# Patient Record
Sex: Male | Born: 1950
Health system: Southern US, Community
[De-identification: ages and names within clinical notes are randomized; demographics above are authoritative.]

## PROBLEM LIST (undated history)

## (undated) DIAGNOSIS — J019 Acute sinusitis, unspecified: Secondary | ICD-10-CM

## (undated) DIAGNOSIS — Z872 Personal history of diseases of the skin and subcutaneous tissue: Secondary | ICD-10-CM

## (undated) DIAGNOSIS — Z86010 Personal history of colonic polyps: Secondary | ICD-10-CM

## (undated) DIAGNOSIS — J309 Allergic rhinitis, unspecified: Secondary | ICD-10-CM

## (undated) DIAGNOSIS — M545 Low back pain: Secondary | ICD-10-CM

## (undated) DIAGNOSIS — K5730 Diverticulosis of large intestine without perforation or abscess without bleeding: Secondary | ICD-10-CM

## (undated) DIAGNOSIS — E669 Obesity, unspecified: Secondary | ICD-10-CM

## (undated) DIAGNOSIS — G4733 Obstructive sleep apnea (adult) (pediatric): Secondary | ICD-10-CM

## (undated) DIAGNOSIS — R5383 Other fatigue: Secondary | ICD-10-CM

## (undated) DIAGNOSIS — E039 Hypothyroidism, unspecified: Secondary | ICD-10-CM

## (undated) DIAGNOSIS — E785 Hyperlipidemia, unspecified: Secondary | ICD-10-CM

## (undated) DIAGNOSIS — Z87448 Personal history of other diseases of urinary system: Secondary | ICD-10-CM

## (undated) DIAGNOSIS — R5381 Other malaise: Secondary | ICD-10-CM

## (undated) DIAGNOSIS — F411 Generalized anxiety disorder: Secondary | ICD-10-CM

## (undated) HISTORY — DX: Obstructive sleep apnea (adult) (pediatric): G47.33

## (undated) HISTORY — DX: Other malaise: R53.81

## (undated) HISTORY — DX: Diverticulosis of large intestine without perforation or abscess without bleeding: K57.30

## (undated) HISTORY — DX: Personal history of diseases of the skin and subcutaneous tissue: Z87.2

## (undated) HISTORY — DX: Personal history of colonic polyps: Z86.010

## (undated) HISTORY — DX: Personal history of other diseases of urinary system: Z87.448

## (undated) HISTORY — DX: Obesity, unspecified: E66.9

## (undated) HISTORY — DX: Generalized anxiety disorder: F41.1

## (undated) HISTORY — DX: Acute sinusitis, unspecified: J01.90

## (undated) HISTORY — DX: Other fatigue: R53.83

## (undated) HISTORY — DX: Hypothyroidism, unspecified: E03.9

## (undated) HISTORY — DX: Allergic rhinitis, unspecified: J30.9

## (undated) HISTORY — PX: OTHER SURGICAL HISTORY: SHX169

## (undated) HISTORY — DX: Hyperlipidemia, unspecified: E78.5

## (undated) HISTORY — DX: Low back pain: M54.5

## (undated) HISTORY — PX: TONSILLECTOMY: SUR1361

---

## 1999-08-25 ENCOUNTER — Encounter (INDEPENDENT_AMBULATORY_CARE_PROVIDER_SITE_OTHER): Payer: Self-pay | Admitting: Specialist

## 1999-08-25 ENCOUNTER — Ambulatory Visit (HOSPITAL_COMMUNITY): Admission: RE | Admit: 1999-08-25 | Discharge: 1999-08-25 | Payer: Self-pay | Admitting: Internal Medicine

## 2004-08-12 ENCOUNTER — Ambulatory Visit: Payer: Self-pay | Admitting: Internal Medicine

## 2004-08-18 ENCOUNTER — Ambulatory Visit: Payer: Self-pay | Admitting: Internal Medicine

## 2004-08-29 ENCOUNTER — Ambulatory Visit: Payer: Self-pay | Admitting: Emergency Medicine

## 2004-10-07 ENCOUNTER — Ambulatory Visit (HOSPITAL_BASED_OUTPATIENT_CLINIC_OR_DEPARTMENT_OTHER): Admission: RE | Admit: 2004-10-07 | Discharge: 2004-10-07 | Payer: Self-pay | Admitting: Emergency Medicine

## 2004-10-12 ENCOUNTER — Ambulatory Visit: Payer: Self-pay | Admitting: Pulmonary Disease

## 2004-11-14 ENCOUNTER — Ambulatory Visit: Payer: Self-pay | Admitting: Emergency Medicine

## 2004-12-22 ENCOUNTER — Ambulatory Visit: Payer: Self-pay | Admitting: Emergency Medicine

## 2005-06-07 ENCOUNTER — Ambulatory Visit: Payer: Self-pay | Admitting: Internal Medicine

## 2005-06-13 ENCOUNTER — Ambulatory Visit (HOSPITAL_COMMUNITY): Admission: RE | Admit: 2005-06-13 | Discharge: 2005-06-13 | Payer: Self-pay | Admitting: Internal Medicine

## 2005-08-15 ENCOUNTER — Ambulatory Visit: Payer: Self-pay | Admitting: Internal Medicine

## 2005-08-21 ENCOUNTER — Ambulatory Visit: Payer: Self-pay | Admitting: Internal Medicine

## 2005-10-24 ENCOUNTER — Ambulatory Visit: Payer: Self-pay | Admitting: Internal Medicine

## 2006-02-26 ENCOUNTER — Ambulatory Visit: Payer: Self-pay | Admitting: Internal Medicine

## 2006-10-09 ENCOUNTER — Encounter: Payer: Self-pay | Admitting: Internal Medicine

## 2006-10-09 DIAGNOSIS — G4733 Obstructive sleep apnea (adult) (pediatric): Secondary | ICD-10-CM | POA: Insufficient documentation

## 2006-10-09 DIAGNOSIS — Z872 Personal history of diseases of the skin and subcutaneous tissue: Secondary | ICD-10-CM

## 2006-10-09 DIAGNOSIS — Z87448 Personal history of other diseases of urinary system: Secondary | ICD-10-CM

## 2006-10-09 DIAGNOSIS — E669 Obesity, unspecified: Secondary | ICD-10-CM

## 2006-10-09 DIAGNOSIS — L719 Rosacea, unspecified: Secondary | ICD-10-CM

## 2006-10-09 HISTORY — DX: Personal history of other diseases of urinary system: Z87.448

## 2006-10-09 HISTORY — DX: Obesity, unspecified: E66.9

## 2006-10-09 HISTORY — DX: Obstructive sleep apnea (adult) (pediatric): G47.33

## 2006-10-09 HISTORY — DX: Personal history of diseases of the skin and subcutaneous tissue: Z87.2

## 2006-10-19 ENCOUNTER — Ambulatory Visit: Payer: Self-pay | Admitting: Internal Medicine

## 2006-10-19 LAB — CONVERTED CEMR LAB
AST: 27 units/L (ref 0–37)
Basophils Absolute: 0 10*3/uL (ref 0.0–0.1)
Basophils Relative: 0.2 % (ref 0.0–1.0)
Bilirubin Urine: NEGATIVE
Chloride: 105 meq/L (ref 96–112)
Creatinine, Ser: 1 mg/dL (ref 0.4–1.5)
Eosinophils Relative: 3.5 % (ref 0.0–5.0)
GFR calc Af Amer: 99 mL/min
HDL: 27 mg/dL — ABNORMAL LOW (ref >39.0)
Hemoglobin: 15.4 g/dL (ref 13.0–17.0)
Ketones, ur: NEGATIVE mg/dL
Leukocytes, UA: NEGATIVE
MCHC: 34.1 g/dL (ref 30.0–36.0)
Monocytes Absolute: 0.4 10*3/uL (ref 0.2–0.7)
Monocytes Relative: 6.1 % (ref 3.0–11.0)
Neutro Abs: 3.5 10*3/uL (ref 1.4–7.7)
Potassium: 4.4 meq/L (ref 3.5–5.1)
RBC: 4.95 M/uL (ref 4.22–5.81)
RDW: 12.3 % (ref 11.5–14.6)
TSH: 5.41 microintl units/mL (ref 0.35–5.50)
Total CHOL/HDL Ratio: 5.8
Total Protein, Urine: NEGATIVE mg/dL
Total Protein: 6.9 g/dL (ref 6.0–8.3)
pH: 6 (ref 5.0–8.0)

## 2006-10-25 ENCOUNTER — Ambulatory Visit: Payer: Self-pay | Admitting: Internal Medicine

## 2006-10-26 DIAGNOSIS — Z860100 Personal history of colon polyps, unspecified: Secondary | ICD-10-CM

## 2006-10-26 DIAGNOSIS — J309 Allergic rhinitis, unspecified: Secondary | ICD-10-CM | POA: Insufficient documentation

## 2006-10-26 DIAGNOSIS — E039 Hypothyroidism, unspecified: Secondary | ICD-10-CM

## 2006-10-26 DIAGNOSIS — E785 Hyperlipidemia, unspecified: Secondary | ICD-10-CM

## 2006-10-26 DIAGNOSIS — M545 Low back pain, unspecified: Secondary | ICD-10-CM | POA: Insufficient documentation

## 2006-10-26 DIAGNOSIS — K5730 Diverticulosis of large intestine without perforation or abscess without bleeding: Secondary | ICD-10-CM

## 2006-10-26 DIAGNOSIS — I10 Essential (primary) hypertension: Secondary | ICD-10-CM

## 2006-10-26 HISTORY — DX: Diverticulosis of large intestine without perforation or abscess without bleeding: K57.30

## 2006-10-26 HISTORY — DX: Personal history of colon polyps, unspecified: Z86.0100

## 2006-10-26 HISTORY — DX: Low back pain, unspecified: M54.50

## 2006-10-26 HISTORY — DX: Hypothyroidism, unspecified: E03.9

## 2006-10-26 HISTORY — DX: Hyperlipidemia, unspecified: E78.5

## 2006-10-26 HISTORY — DX: Allergic rhinitis, unspecified: J30.9

## 2006-10-26 HISTORY — DX: Personal history of colonic polyps: Z86.010

## 2007-05-10 ENCOUNTER — Ambulatory Visit: Payer: Self-pay | Admitting: Internal Medicine

## 2007-05-10 DIAGNOSIS — J019 Acute sinusitis, unspecified: Secondary | ICD-10-CM | POA: Insufficient documentation

## 2007-05-10 HISTORY — DX: Acute sinusitis, unspecified: J01.90

## 2007-05-15 ENCOUNTER — Ambulatory Visit: Payer: Self-pay | Admitting: Internal Medicine

## 2007-05-15 DIAGNOSIS — R5381 Other malaise: Secondary | ICD-10-CM | POA: Insufficient documentation

## 2007-05-15 DIAGNOSIS — R5383 Other fatigue: Secondary | ICD-10-CM

## 2007-05-15 HISTORY — DX: Other malaise: R53.81

## 2007-10-22 ENCOUNTER — Ambulatory Visit: Payer: Self-pay | Admitting: Internal Medicine

## 2007-10-22 LAB — CONVERTED CEMR LAB
AST: 29 units/L (ref 0–37)
Basophils Absolute: 0 10*3/uL (ref 0.0–0.1)
Basophils Relative: 0.8 % (ref 0.0–3.0)
Bilirubin, Direct: 0.1 mg/dL (ref 0.0–0.3)
Calcium: 9.3 mg/dL (ref 8.4–10.5)
Cholesterol: 161 mg/dL (ref 0–200)
Eosinophils Absolute: 0.2 10*3/uL (ref 0.0–0.7)
GFR calc non Af Amer: 73 mL/min
Hemoglobin, Urine: NEGATIVE
Ketones, ur: NEGATIVE mg/dL
LDL Cholesterol: 99 mg/dL (ref 0–99)
Leukocytes, UA: NEGATIVE
MCHC: 34.8 g/dL (ref 30.0–36.0)
Neutrophils Relative %: 52.4 % (ref 43.0–77.0)
Nitrite: NEGATIVE
PSA: 0.58 ng/mL (ref 0.10–4.00)
Platelets: 161 10*3/uL (ref 150–400)
Sodium: 143 meq/L (ref 135–145)
TSH: 5.08 microintl units/mL (ref 0.35–5.50)
Total CHOL/HDL Ratio: 6.7
Triglycerides: 189 mg/dL — ABNORMAL HIGH (ref 0–149)
Urine Glucose: NEGATIVE mg/dL
Urobilinogen, UA: 0.2 (ref 0.0–1.0)

## 2007-10-29 ENCOUNTER — Ambulatory Visit: Payer: Self-pay | Admitting: Internal Medicine

## 2007-11-27 ENCOUNTER — Encounter: Payer: Self-pay | Admitting: Internal Medicine

## 2007-12-05 ENCOUNTER — Encounter: Admission: RE | Admit: 2007-12-05 | Discharge: 2007-12-05 | Payer: Self-pay | Admitting: Orthopaedic Surgery

## 2008-01-07 ENCOUNTER — Ambulatory Visit: Payer: Self-pay | Admitting: Internal Medicine

## 2008-01-22 ENCOUNTER — Encounter: Payer: Self-pay | Admitting: Internal Medicine

## 2008-01-22 ENCOUNTER — Ambulatory Visit: Payer: Self-pay | Admitting: Internal Medicine

## 2008-01-22 LAB — HM COLONOSCOPY

## 2008-01-23 ENCOUNTER — Encounter: Payer: Self-pay | Admitting: Internal Medicine

## 2008-11-12 ENCOUNTER — Ambulatory Visit: Payer: Self-pay | Admitting: Internal Medicine

## 2008-11-12 LAB — CONVERTED CEMR LAB
Alkaline Phosphatase: 82 units/L (ref 39–117)
Basophils Relative: 0.4 % (ref 0.0–3.0)
Bilirubin, Direct: 0.2 mg/dL (ref 0.0–0.3)
CO2: 27 meq/L (ref 19–32)
Calcium: 9.1 mg/dL (ref 8.4–10.5)
Chloride: 105 meq/L (ref 96–112)
Cholesterol: 154 mg/dL (ref 0–200)
Eosinophils Relative: 3.4 % (ref 0.0–5.0)
GFR calc non Af Amer: 72.45 mL/min (ref >60)
Glucose, Bld: 87 mg/dL (ref 70–99)
HCT: 46.5 % (ref 39.0–52.0)
LDL Cholesterol: 89 mg/dL (ref 0–99)
Leukocytes, UA: NEGATIVE
Lymphocytes Relative: 27.1 % (ref 12.0–46.0)
MCHC: 34.6 g/dL (ref 30.0–36.0)
Monocytes Relative: 5.7 % (ref 3.0–12.0)
Platelets: 152 10*3/uL (ref 150.0–400.0)
Potassium: 4.1 meq/L (ref 3.5–5.1)
RDW: 12.4 % (ref 11.5–14.6)
TSH: 5.09 microintl units/mL (ref 0.35–5.50)
Total Bilirubin: 1.2 mg/dL (ref 0.3–1.2)
Total CHOL/HDL Ratio: 6
WBC: 6.3 10*3/uL (ref 4.5–10.5)

## 2008-11-18 ENCOUNTER — Ambulatory Visit: Payer: Self-pay | Admitting: Internal Medicine

## 2009-11-15 ENCOUNTER — Ambulatory Visit: Payer: Self-pay | Admitting: Internal Medicine

## 2009-11-15 LAB — CONVERTED CEMR LAB
AST: 25 units/L (ref 0–37)
Albumin: 4.1 g/dL (ref 3.5–5.2)
Alkaline Phosphatase: 94 units/L (ref 39–117)
BUN: 16 mg/dL (ref 6–23)
Basophils Absolute: 0 10*3/uL (ref 0.0–0.1)
Bilirubin, Direct: 0.2 mg/dL (ref 0.0–0.3)
CO2: 26 meq/L (ref 19–32)
Calcium: 9 mg/dL (ref 8.4–10.5)
Chloride: 102 meq/L (ref 96–112)
Creatinine, Ser: 1 mg/dL (ref 0.4–1.5)
Direct LDL: 129.8 mg/dL
Eosinophils Relative: 4.6 % (ref 0.0–5.0)
HCT: 43.9 % (ref 39.0–52.0)
Hemoglobin, Urine: NEGATIVE
Hemoglobin: 15.3 g/dL (ref 13.0–17.0)
Lymphocytes Relative: 32.4 % (ref 12.0–46.0)
Monocytes Absolute: 0.4 10*3/uL (ref 0.1–1.0)
Monocytes Relative: 6.5 % (ref 3.0–12.0)
Platelets: 157 10*3/uL (ref 150.0–400.0)
RBC: 4.77 M/uL (ref 4.22–5.81)
Total CHOL/HDL Ratio: 6
Total Protein: 6.8 g/dL (ref 6.0–8.3)
VLDL: 51.2 mg/dL — ABNORMAL HIGH (ref 0.0–40.0)

## 2009-11-18 ENCOUNTER — Ambulatory Visit: Payer: Self-pay | Admitting: Internal Medicine

## 2009-11-18 ENCOUNTER — Encounter: Payer: Self-pay | Admitting: Internal Medicine
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
## 2010-07-01 NOTE — Procedures (Signed)
[facility]  Patient:    Jay Chapman, Jay Chapman                      MRN: [id number] Proc. Date: 08/25/99 Adm. Date:  [id number] Attending:  Estella Husk CC:         [name], M.D. [facility]                           Procedure Report  REFERRING PHYSICIAN:  [name], M.D.  PROCEDURE:  Colonoscopy with snare polypectomy.  ENDOSCOPIST:  Wilhemina Bonito. Eda Keys., M.D.  INDICATION:  Colorectal neoplasia screening.  HISTORY:  This is a healthy 60 year old gentleman who recently underwent comprehensive evaluation with Dr. Jonny Ruiz.  He has a family history of colon polyps in his mother.  He has not had prior colorectal neoplasia screening and was referred straight away for that exam.  The nature of the procedure as well as the risks, benefits and alternatives were reviewed.  He understood and agreed to proceed.  PHYSICAL EXAMINATION:  Well-appearing male in no acute distress.  He is alert and oriented.  Vital signs are stable.  Lungs are clear.  Heart is regular. Abdomen soft.  DESCRIPTION OF PROCEDURE:  After informed consent was obtained, patient was sedated with Demerol 90 mg and Versed 6 mg IV.  Rectal exam was performed and found to be unremarkable.  The Olympus colonoscope was passed under direct vision per rectum and advanced through the entire length of the colon to the cecal tip.  Preparation was excellent.  Terminal ileum was intubated and appeared normal.  Careful examination of the colonic mucosa from the cecal tip to rectum revealed pan-diverticulosis.  Additionally, an 8-mm pedunculated polyp was located in the sigmoid colon at 30 cm from the anal verge.  Tip of the polyp was slightly friable.  The polyp was removed with snare cautery and submitted for pathologic analysis.  Retroflexed view of the rectum was negative.  IMPRESSION: 1. Pan-diverticulosis. 2. Sigmoid colon polyp, status post polypectomy.  RECOMMENDATIONS: 1. Standard  post-polypectomy orders. 2. Follow up pathology. 3. Repeat colonoscopy in three years if polyp adenomatous. DD:  08/25/99 TD:  08/25/99 Job: [id number]

## 2010-07-01 NOTE — Procedures (Signed)
NAMEMARCOANTONIO, Jay Chapman               ACCOUNT NO.:  [id number]   MEDICAL RECORD NO.:  [id number]          PATIENT TYPE:  OUT   LOCATION:  SLEEP CENTER                 FACILITY:  [facility]   PHYSICIAN:  [name], M.D. [facility] DATE OF BIRTH:  1950/06/28   DATE OF STUDY:  10/07/2004                              NOCTURNAL POLYSOMNOGRAM   REFERRING PHYSICIAN:  Dr. Levy Pupa.   DATE OF STUDY:  October 07, 2004.   INDICATION FOR STUDY:  Hypersomnia with sleep apnea. Epworth score: 8.   SLEEP ARCHITECTURE:  The patient had a total sleep time of 302 minutes with  significantly decreased slow wave sleep but adequate REM. Sleep onset  latency was normal as was REM onset. Sleep efficiency was decreased at 70%.   IMPRESSION:  1.  Split night study reveals severe obstructive sleep apnea with 188      obstructive events noted in the first 134 minutes of sleep. This gave      the patient a respiratory disturbance index of 85 events per hour with      O2 desaturations as low as 86%. The events were not positional but they      were associated with moderate snoring. By protocol, the patient was then      placed on a medium Respironics comfort gel mask, and C-PAP titration was      initiated. At a final pressure of 10 cm, there was excellent control of      both obstructive events and snoring even through REM.  2.  Occasional cardiac decelerations associated with the patient's      obstructive events. However, they did not appeared to be clinically      significant.  3.  Large numbers of leg jerks with very little sleep disruption.           ______________________________  [name], M.D. [facility]     KC/MEDQ  D:  10/13/2004 16:24:36  T:  10/13/2004 23:06:10  Job:  [id number]

## 2010-10-04 ENCOUNTER — Ambulatory Visit: Payer: BC Managed Care – PPO

## 2010-10-04 DIAGNOSIS — Z0389 Encounter for observation for other suspected diseases and conditions ruled out: Secondary | ICD-10-CM

## 2010-10-04 DIAGNOSIS — Z0000 Encounter for general adult medical examination without abnormal findings: Secondary | ICD-10-CM

## 2010-10-04 LAB — CBC WITH DIFFERENTIAL/PLATELET
Basophils Relative: 0.6 % (ref 0.0–3.0)
Eosinophils Absolute: 0.4 10*3/uL (ref 0.0–0.7)
HCT: 45.7 % (ref 39.0–52.0)
Hemoglobin: 15.5 g/dL (ref 13.0–17.0)
Lymphs Abs: 1.7 10*3/uL (ref 0.7–4.0)
Monocytes Absolute: 0.4 10*3/uL (ref 0.1–1.0)
RBC: 4.93 Mil/uL (ref 4.22–5.81)

## 2010-10-04 LAB — URINALYSIS
Bilirubin Urine: NEGATIVE
Ketones, ur: NEGATIVE
Nitrite: NEGATIVE
Urine Glucose: NEGATIVE
pH: 6 (ref 5.0–8.0)

## 2010-10-04 LAB — BASIC METABOLIC PANEL
BUN: 15 mg/dL (ref 6–23)
CO2: 28 mEq/L (ref 19–32)
Chloride: 108 mEq/L (ref 96–112)
Creatinine, Ser: 0.9 mg/dL (ref 0.4–1.5)
GFR: 88.49 mL/min (ref >60.00)
Sodium: 139 mEq/L (ref 135–145)

## 2010-10-04 LAB — HEPATIC FUNCTION PANEL
AST: 27 U/L (ref 0–37)
Bilirubin, Direct: 0.2 mg/dL (ref 0.0–0.3)

## 2010-10-04 LAB — LIPID PANEL
LDL Cholesterol: 91 mg/dL (ref 0–99)
Triglycerides: 105 mg/dL (ref 0.0–149.0)

## 2010-10-11 ENCOUNTER — Encounter: Payer: Self-pay | Admitting: Internal Medicine

## 2010-10-11 DIAGNOSIS — Z0000 Encounter for general adult medical examination without abnormal findings: Secondary | ICD-10-CM | POA: Insufficient documentation

## 2010-10-11 DIAGNOSIS — Z0001 Encounter for general adult medical examination with abnormal findings: Secondary | ICD-10-CM | POA: Insufficient documentation

## 2010-10-12 ENCOUNTER — Encounter: Payer: Self-pay | Admitting: Internal Medicine

## 2010-10-12 ENCOUNTER — Ambulatory Visit (INDEPENDENT_AMBULATORY_CARE_PROVIDER_SITE_OTHER): Payer: BC Managed Care – PPO | Admitting: Internal Medicine

## 2010-10-12 VITALS — BP 114/78 | HR 76 | Temp 99.2°F | Ht 73.0 in | Wt 230.4 lb

## 2010-10-12 DIAGNOSIS — J309 Allergic rhinitis, unspecified: Secondary | ICD-10-CM

## 2010-10-12 DIAGNOSIS — Z0000 Encounter for general adult medical examination without abnormal findings: Secondary | ICD-10-CM

## 2010-10-12 DIAGNOSIS — E785 Hyperlipidemia, unspecified: Secondary | ICD-10-CM

## 2010-10-12 MED ORDER — AMLODIPINE BESY-BENAZEPRIL HCL 5-40 MG PO CAPS: 1.0000 | ORAL_CAPSULE | Freq: Every day | ORAL | Status: DC

## 2010-10-12 MED ORDER — ATORVASTATIN CALCIUM 40 MG PO TABS: 40.0000 mg | ORAL_TABLET | Freq: Every day | ORAL | Status: DC

## 2010-10-12 MED ORDER — LEVOTHYROXINE SODIUM 25 MCG PO TABS: 25.0000 ug | ORAL_TABLET | Freq: Every day | ORAL | Status: DC

## 2010-10-12 NOTE — Assessment & Plan Note (Signed)
crestor too expensive - to change to liptor 40 mg qd

## 2010-10-12 NOTE — Assessment & Plan Note (Signed)
Ok for allegra otc prn,  to f/u any worsening symptoms or concerns 

## 2010-10-12 NOTE — Patient Instructions (Signed)
Continue all other medications as before You are given the refills today Your EKG was good today You are otherwise up to date with prevention this year Please return in 1 year for your yearly visit, or sooner if needed, with Lab testing done 3-5 days before

## 2010-10-12 NOTE — Assessment & Plan Note (Signed)
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
## 2011-12-11 ENCOUNTER — Telehealth: Payer: Self-pay

## 2011-12-11 ENCOUNTER — Other Ambulatory Visit (INDEPENDENT_AMBULATORY_CARE_PROVIDER_SITE_OTHER): Payer: BC Managed Care – PPO

## 2011-12-11 DIAGNOSIS — Z0000 Encounter for general adult medical examination without abnormal findings: Secondary | ICD-10-CM

## 2011-12-11 LAB — URINALYSIS, ROUTINE W REFLEX MICROSCOPIC
Bilirubin Urine: NEGATIVE
Leukocytes, UA: NEGATIVE
Nitrite: NEGATIVE
Specific Gravity, Urine: <=1.005 (ref 1.000–1.030)
Total Protein, Urine: NEGATIVE
pH: 6 (ref 5.0–8.0)

## 2011-12-11 LAB — CBC WITH DIFFERENTIAL/PLATELET
Basophils Absolute: 0.1 10*3/uL (ref 0.0–0.1)
Eosinophils Absolute: 0.3 10*3/uL (ref 0.0–0.7)
Lymphocytes Relative: 30.5 % (ref 12.0–46.0)
MCHC: 33.5 g/dL (ref 30.0–36.0)
MCV: 93.2 fl (ref 78.0–100.0)
Monocytes Absolute: 0.4 10*3/uL (ref 0.1–1.0)
Neutrophils Relative %: 56.3 % (ref 43.0–77.0)
Platelets: 168 10*3/uL (ref 150.0–400.0)

## 2011-12-11 LAB — BASIC METABOLIC PANEL
CO2: 27 mEq/L (ref 19–32)
Chloride: 106 mEq/L (ref 96–112)
Glucose, Bld: 79 mg/dL (ref 70–99)
Potassium: 4.7 mEq/L (ref 3.5–5.1)
Sodium: 140 mEq/L (ref 135–145)

## 2011-12-11 LAB — HEPATIC FUNCTION PANEL
AST: 38 U/L — ABNORMAL HIGH (ref 0–37)
Alkaline Phosphatase: 78 U/L (ref 39–117)

## 2011-12-11 LAB — TSH: TSH: 7.25 u[IU]/mL — ABNORMAL HIGH (ref 0.35–5.50)

## 2011-12-11 LAB — LIPID PANEL
Cholesterol: 203 mg/dL — ABNORMAL HIGH (ref 0–200)
Triglycerides: 198 mg/dL — ABNORMAL HIGH (ref 0.0–149.0)

## 2011-12-11 LAB — PSA: PSA: 0.46 ng/mL (ref 0.10–4.00)

## 2011-12-11 NOTE — Telephone Encounter (Signed)
Put order in for physical labs. 

## 2011-12-15 ENCOUNTER — Ambulatory Visit (INDEPENDENT_AMBULATORY_CARE_PROVIDER_SITE_OTHER): Payer: BC Managed Care – PPO | Admitting: Internal Medicine

## 2011-12-15 ENCOUNTER — Encounter: Payer: Self-pay | Admitting: Internal Medicine

## 2011-12-15 VITALS — BP 126/80 | HR 71 | Temp 97.7°F | Ht 73.0 in | Wt 241.5 lb

## 2011-12-15 DIAGNOSIS — Z0000 Encounter for general adult medical examination without abnormal findings: Secondary | ICD-10-CM

## 2011-12-15 DIAGNOSIS — E039 Hypothyroidism, unspecified: Secondary | ICD-10-CM

## 2011-12-15 DIAGNOSIS — E785 Hyperlipidemia, unspecified: Secondary | ICD-10-CM

## 2011-12-15 MED ORDER — ASPIRIN 81 MG PO TBEC: 81.0000 mg | DELAYED_RELEASE_TABLET | Freq: Every day | ORAL | Status: DC

## 2011-12-15 MED ORDER — ATORVASTATIN CALCIUM 40 MG PO TABS: 40.0000 mg | ORAL_TABLET | Freq: Every day | ORAL | Status: DC

## 2011-12-15 MED ORDER — AMLODIPINE BESY-BENAZEPRIL HCL 5-40 MG PO CAPS: 1.0000 | ORAL_CAPSULE | Freq: Every day | ORAL | Status: DC

## 2011-12-15 MED ORDER — LEVOTHYROXINE SODIUM 50 MCG PO TABS: 50.0000 ug | ORAL_TABLET | Freq: Every day | ORAL | Status: DC

## 2011-12-15 NOTE — Patient Instructions (Addendum)
Ok to increase the thyroid medication to 50 mcg per day Please return in 4 wks for Lab only (the thyroid and cholesterol levels) Continue all other medications as before OK to take only the Aspirin at 81 mg - (not the 325 mg) Please return if your BCBS covers the shingles shot (for a nurse visit) Please continue your efforts at being more active, low cholesterol diet, and weight control. Please remember to sign up for My Chart at your earliest convenience, as this will be important to you in the future with finding out test results. Please return in 1 year for your yearly visit, or sooner if needed, with Lab testing done 3-5 days before

## 2011-12-16 ENCOUNTER — Encounter: Payer: Self-pay | Admitting: Internal Medicine

## 2011-12-16 NOTE — Assessment & Plan Note (Signed)
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
## 2011-12-16 NOTE — Assessment & Plan Note (Signed)
Uncontrolled, goal ldl < 100, to re-start lipitor 40 mg

## 2011-12-16 NOTE — Assessment & Plan Note (Signed)
With mild increased tsh - for increase levothyroxin, f/U lab in 4 wks

## 2012-01-18 ENCOUNTER — Other Ambulatory Visit (INDEPENDENT_AMBULATORY_CARE_PROVIDER_SITE_OTHER): Payer: BC Managed Care – PPO

## 2012-01-18 DIAGNOSIS — E785 Hyperlipidemia, unspecified: Secondary | ICD-10-CM

## 2012-01-18 DIAGNOSIS — E039 Hypothyroidism, unspecified: Secondary | ICD-10-CM

## 2012-01-18 LAB — LIPID PANEL
HDL: 31.4 mg/dL — ABNORMAL LOW (ref >39.00)
Triglycerides: 174 mg/dL — ABNORMAL HIGH (ref 0.0–149.0)

## 2012-01-18 LAB — HEPATIC FUNCTION PANEL
AST: 31 U/L (ref 0–37)
Albumin: 3.9 g/dL (ref 3.5–5.2)
Alkaline Phosphatase: 90 U/L (ref 39–117)
Total Protein: 7 g/dL (ref 6.0–8.3)

## 2012-07-16 ENCOUNTER — Encounter: Payer: Self-pay | Admitting: Internal Medicine

## 2012-07-16 ENCOUNTER — Encounter: Payer: Self-pay | Admitting: Emergency Medicine

## 2012-11-28 ENCOUNTER — Encounter: Payer: Self-pay | Admitting: Internal Medicine

## 2012-12-10 ENCOUNTER — Other Ambulatory Visit (INDEPENDENT_AMBULATORY_CARE_PROVIDER_SITE_OTHER): Payer: BC Managed Care – PPO

## 2012-12-10 DIAGNOSIS — Z0000 Encounter for general adult medical examination without abnormal findings: Secondary | ICD-10-CM

## 2012-12-10 LAB — LIPID PANEL
LDL Cholesterol: 93 mg/dL (ref 0–99)
VLDL: 37.8 mg/dL (ref 0.0–40.0)

## 2012-12-10 LAB — URINALYSIS, ROUTINE W REFLEX MICROSCOPIC
Ketones, ur: NEGATIVE
Specific Gravity, Urine: 1.01 (ref 1.000–1.030)
Total Protein, Urine: NEGATIVE
Urine Glucose: NEGATIVE
Urobilinogen, UA: 0.2 (ref 0.0–1.0)

## 2012-12-10 LAB — CBC WITH DIFFERENTIAL/PLATELET
Eosinophils Relative: 3.9 % (ref 0.0–5.0)
Lymphocytes Relative: 31.2 % (ref 12.0–46.0)
MCV: 90.5 fl (ref 78.0–100.0)
Monocytes Absolute: 0.4 10*3/uL (ref 0.1–1.0)
Monocytes Relative: 5.8 % (ref 3.0–12.0)
Neutrophils Relative %: 58.3 % (ref 43.0–77.0)
Platelets: 173 10*3/uL (ref 150.0–400.0)
RBC: 4.93 Mil/uL (ref 4.22–5.81)
RDW: 13.4 % (ref 11.5–14.6)
WBC: 6.3 10*3/uL (ref 4.5–10.5)

## 2012-12-10 LAB — BASIC METABOLIC PANEL
BUN: 13 mg/dL (ref 6–23)
Calcium: 9.4 mg/dL (ref 8.4–10.5)
Chloride: 106 mEq/L (ref 96–112)
GFR: 77.13 mL/min (ref >60.00)
Glucose, Bld: 90 mg/dL (ref 70–99)
Potassium: 4.5 mEq/L (ref 3.5–5.1)

## 2012-12-10 LAB — HEPATIC FUNCTION PANEL
ALT: 38 U/L (ref 0–53)
AST: 22 U/L (ref 0–37)
Bilirubin, Direct: 0.2 mg/dL (ref 0.0–0.3)
Total Bilirubin: 1.1 mg/dL (ref 0.3–1.2)

## 2012-12-10 LAB — TSH: TSH: 6.31 u[IU]/mL — ABNORMAL HIGH (ref 0.35–5.50)

## 2012-12-17 ENCOUNTER — Encounter: Payer: Self-pay | Admitting: Internal Medicine

## 2012-12-17 ENCOUNTER — Ambulatory Visit (INDEPENDENT_AMBULATORY_CARE_PROVIDER_SITE_OTHER): Payer: BC Managed Care – PPO | Admitting: Internal Medicine

## 2012-12-17 VITALS — BP 132/82 | HR 88 | Temp 98.9°F | Ht 73.0 in | Wt 245.5 lb

## 2012-12-17 DIAGNOSIS — Z0000 Encounter for general adult medical examination without abnormal findings: Secondary | ICD-10-CM

## 2012-12-17 DIAGNOSIS — E039 Hypothyroidism, unspecified: Secondary | ICD-10-CM

## 2012-12-17 MED ORDER — LEVOTHYROXINE SODIUM 50 MCG PO TABS: 50.0000 ug | ORAL_TABLET | Freq: Every day | ORAL | Status: DC

## 2012-12-17 MED ORDER — LEVOTHYROXINE SODIUM 75 MCG PO TABS: 75.0000 ug | ORAL_TABLET | Freq: Every day | ORAL | Status: DC

## 2012-12-17 MED ORDER — AMLODIPINE BESY-BENAZEPRIL HCL 5-40 MG PO CAPS: 1.0000 | ORAL_CAPSULE | Freq: Every day | ORAL | Status: DC
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
## 2012-12-17 NOTE — Patient Instructions (Signed)
OK to stop the levothyroxine at 50 mcg per day Please take all new medication as prescribed - the levothyroxine 75 mcg per day Please continue all other medications as before, and refills have been done if requested. Please have the pharmacy call with any other refills you may need. Please keep your appointments with your specialists as you may have planned Please continue your efforts at being more active, low cholesterol diet, and weight control. You are otherwise up to date with prevention measures today.  Please remember to sign up for My Chart if you have not done so, as this will be important to you in the future with finding out test results, communicating by private email, and scheduling acute appointments online when needed.  Please go to third floor with your letter for colonoscopy scheduling follow up - Gastroenterology, Dr Marina Goodell  Please return in 1 year for your yearly visit, or sooner if needed, with Lab testing done 3-5 days before

## 2012-12-17 NOTE — Assessment & Plan Note (Addendum)

## 2012-12-17 NOTE — Progress Notes (Signed)
Pre-visit discussion using our clinic review tool. No additional management support is needed unless otherwise documented below in the visit note.  

## 2012-12-17 NOTE — Assessment & Plan Note (Signed)
With mild elev tsh - for incr levothyr from 50 to 75

## 2013-01-02 ENCOUNTER — Other Ambulatory Visit: Payer: Self-pay | Admitting: Internal Medicine

## 2013-04-15 ENCOUNTER — Encounter: Payer: Self-pay | Admitting: Internal Medicine

## 2013-04-16 MED ORDER — AMLODIPINE BESYLATE 5 MG PO TABS: 5.0000 mg | ORAL_TABLET | Freq: Every day | ORAL | Status: DC

## 2013-04-16 MED ORDER — BENAZEPRIL HCL 40 MG PO TABS: 40.0000 mg | ORAL_TABLET | Freq: Every day | ORAL | Status: DC

## 2013-04-17 ENCOUNTER — Other Ambulatory Visit: Payer: Self-pay

## 2013-04-17 MED ORDER — ATORVASTATIN CALCIUM 40 MG PO TABS: 40.0000 mg | ORAL_TABLET | Freq: Every day | ORAL | Status: DC

## 2013-04-17 MED ORDER — LEVOTHYROXINE SODIUM 75 MCG PO TABS: 75.0000 ug | ORAL_TABLET | Freq: Every day | ORAL | Status: DC

## 2013-07-12 ENCOUNTER — Encounter: Payer: Self-pay | Admitting: Internal Medicine

## 2013-07-25 ENCOUNTER — Ambulatory Visit (INDEPENDENT_AMBULATORY_CARE_PROVIDER_SITE_OTHER): Payer: Medicare HMO | Admitting: Internal Medicine

## 2013-07-25 ENCOUNTER — Encounter: Payer: Self-pay | Admitting: Internal Medicine

## 2013-07-25 VITALS — BP 122/84 | HR 74 | Temp 98.5°F | Ht 73.0 in | Wt 242.0 lb

## 2013-07-25 DIAGNOSIS — Z0000 Encounter for general adult medical examination without abnormal findings: Secondary | ICD-10-CM

## 2013-07-25 DIAGNOSIS — Z23 Encounter for immunization: Secondary | ICD-10-CM

## 2013-07-25 DIAGNOSIS — H9190 Unspecified hearing loss, unspecified ear: Secondary | ICD-10-CM

## 2013-07-25 DIAGNOSIS — H6091 Unspecified otitis externa, right ear: Secondary | ICD-10-CM | POA: Insufficient documentation

## 2013-07-25 DIAGNOSIS — H9191 Unspecified hearing loss, right ear: Secondary | ICD-10-CM | POA: Insufficient documentation

## 2013-07-25 DIAGNOSIS — H60399 Other infective otitis externa, unspecified ear: Secondary | ICD-10-CM

## 2013-07-25 MED ORDER — NEOMYCIN-POLYMYXIN-HC 3.5-10000-1 OT SOLN: 4.0000 [drp] | Freq: Four times a day (QID) | OTIC | Status: DC

## 2013-07-25 NOTE — Progress Notes (Signed)
Pre visit review using our clinic review tool, if applicable. No additional management support is needed unless otherwise documented below in the visit note. 
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
## 2013-07-25 NOTE — Patient Instructions (Signed)
Your right ear was irrigated today  You had the new Prevnar pneumonia shot today  Please take all new medication as prescribed - the ear drops for the right ear only for infection  Please continue all other medications as before, and refills have been done if requested.  Please have the pharmacy call with any other refills you may need.  Please return in Oct 2015, or sooner if needed, with Lab testing done 3-5 days before

## 2013-07-28 NOTE — Assessment & Plan Note (Signed)
Mild to mod, for antibx course,  to f/u any worsening symptoms or concerns 

## 2013-07-28 NOTE — Assessment & Plan Note (Signed)
Improved with irrigation 

## 2013-09-15 ENCOUNTER — Encounter: Payer: Self-pay | Admitting: Internal Medicine

## 2013-09-19 ENCOUNTER — Ambulatory Visit: Payer: BC Managed Care – PPO | Admitting: Internal Medicine

## 2013-11-18 ENCOUNTER — Other Ambulatory Visit (INDEPENDENT_AMBULATORY_CARE_PROVIDER_SITE_OTHER): Payer: Medicare HMO

## 2013-11-18 DIAGNOSIS — E039 Hypothyroidism, unspecified: Secondary | ICD-10-CM | POA: Diagnosis not present

## 2013-11-18 DIAGNOSIS — Z0000 Encounter for general adult medical examination without abnormal findings: Secondary | ICD-10-CM | POA: Diagnosis not present

## 2013-11-18 LAB — CBC WITH DIFFERENTIAL/PLATELET
BASOS ABS: 0 10*3/uL (ref 0.0–0.1)
BASOS PCT: 0.6 % (ref 0.0–3.0)
Eosinophils Absolute: 0.4 10*3/uL (ref 0.0–0.7)
Eosinophils Relative: 4.8 % (ref 0.0–5.0)
HCT: 46.3 % (ref 39.0–52.0)
Hemoglobin: 15.8 g/dL (ref 13.0–17.0)
LYMPHS PCT: 28.8 % (ref 12.0–46.0)
Lymphs Abs: 2.1 10*3/uL (ref 0.7–4.0)
MCHC: 34.2 g/dL (ref 30.0–36.0)
MCV: 90.9 fl (ref 78.0–100.0)
Monocytes Absolute: 0.5 10*3/uL (ref 0.1–1.0)
Monocytes Relative: 7.2 % (ref 3.0–12.0)
NEUTROS PCT: 58.6 % (ref 43.0–77.0)
Neutro Abs: 4.3 10*3/uL (ref 1.4–7.7)
Platelets: 178 10*3/uL (ref 150.0–400.0)
RBC: 5.09 Mil/uL (ref 4.22–5.81)
RDW: 13.6 % (ref 11.5–15.5)
WBC: 7.3 10*3/uL (ref 4.0–10.5)

## 2013-11-18 LAB — URINALYSIS, ROUTINE W REFLEX MICROSCOPIC
Bilirubin Urine: NEGATIVE
HGB URINE DIPSTICK: NEGATIVE
Ketones, ur: NEGATIVE
LEUKOCYTES UA: NEGATIVE
Nitrite: NEGATIVE
RBC / HPF: NONE SEEN (ref 0–?)
Specific Gravity, Urine: <=1.005 — AB (ref 1.000–1.030)
TOTAL PROTEIN, URINE-UPE24: NEGATIVE
Urine Glucose: NEGATIVE
Urobilinogen, UA: 0.2 (ref 0.0–1.0)
pH: 7 (ref 5.0–8.0)

## 2013-11-19 LAB — LIPID PANEL
CHOLESTEROL: 169 mg/dL (ref 0–200)
HDL: 26.4 mg/dL — ABNORMAL LOW (ref >39.00)
NONHDL: 142.6
Total CHOL/HDL Ratio: 6
Triglycerides: 252 mg/dL — ABNORMAL HIGH (ref 0.0–149.0)
VLDL: 50.4 mg/dL — ABNORMAL HIGH (ref 0.0–40.0)

## 2013-11-19 LAB — BASIC METABOLIC PANEL
BUN: 18 mg/dL (ref 6–23)
CHLORIDE: 104 meq/L (ref 96–112)
CO2: 24 meq/L (ref 19–32)
CREATININE: 1.1 mg/dL (ref 0.4–1.5)
Calcium: 9.5 mg/dL (ref 8.4–10.5)
GFR: 69.11 mL/min (ref >60.00)
Glucose, Bld: 75 mg/dL (ref 70–99)
Potassium: 4.9 mEq/L (ref 3.5–5.1)
Sodium: 138 mEq/L (ref 135–145)

## 2013-11-19 LAB — HEPATIC FUNCTION PANEL
ALT: 41 U/L (ref 0–53)
AST: 23 U/L (ref 0–37)
Albumin: 4.4 g/dL (ref 3.5–5.2)
Alkaline Phosphatase: 108 U/L (ref 39–117)
BILIRUBIN DIRECT: 0.2 mg/dL (ref 0.0–0.3)
TOTAL PROTEIN: 7.2 g/dL (ref 6.0–8.3)
Total Bilirubin: 1.2 mg/dL (ref 0.2–1.2)

## 2013-11-19 LAB — PSA: PSA: 0.54 ng/mL (ref 0.10–4.00)

## 2013-11-19 LAB — TSH: TSH: 4.62 u[IU]/mL — ABNORMAL HIGH (ref 0.35–4.50)

## 2013-11-19 LAB — LDL CHOLESTEROL, DIRECT: Direct LDL: 96.7 mg/dL

## 2013-11-25 ENCOUNTER — Ambulatory Visit (INDEPENDENT_AMBULATORY_CARE_PROVIDER_SITE_OTHER): Payer: Medicare HMO | Admitting: Internal Medicine

## 2013-11-25 ENCOUNTER — Encounter: Payer: Self-pay | Admitting: Internal Medicine

## 2013-11-25 VITALS — BP 130/82 | HR 80 | Temp 98.1°F | Wt 243.0 lb

## 2013-11-25 DIAGNOSIS — Z0000 Encounter for general adult medical examination without abnormal findings: Secondary | ICD-10-CM

## 2013-11-25 DIAGNOSIS — Z23 Encounter for immunization: Secondary | ICD-10-CM

## 2013-11-25 MED ORDER — LEVOTHYROXINE SODIUM 75 MCG PO TABS: 75.0000 ug | ORAL_TABLET | Freq: Every day | ORAL | Status: DC

## 2013-11-25 MED ORDER — BENAZEPRIL HCL 40 MG PO TABS
40.0000 mg | ORAL_TABLET | Freq: Every day | ORAL | Status: DC
Start: 2013-11-25 — End: 2014-12-02

## 2013-11-25 MED ORDER — AMLODIPINE BESYLATE 5 MG PO TABS: 5.0000 mg | ORAL_TABLET | Freq: Every day | ORAL | Status: DC

## 2013-11-25 MED ORDER — ATORVASTATIN CALCIUM 40 MG PO TABS: 40.0000 mg | ORAL_TABLET | Freq: Every day | ORAL | Status: DC

## 2013-11-25 NOTE — Patient Instructions (Addendum)
You had the flu shot today  Please continue all other medications as before, and refills have been done if requested.  Please have the pharmacy call with any other refills you may need.  Please continue your efforts at being more active, low cholesterol diet, and weight control.  You are otherwise up to date with prevention measures today.  Please keep your appointments with your specialists as you may have planned  Please call if you change your mind about the colonoscopy; your cologuard has been ordered -   Please return in 1 year for your yearly visit, or sooner if needed, with Lab testing done 3-5 days before

## 2013-11-25 NOTE — Progress Notes (Signed)
Pre visit review using our clinic review tool, if applicable. No additional management support is needed unless otherwise documented below in the visit note. 

## 2013-11-25 NOTE — Assessment & Plan Note (Signed)
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
## 2014-01-12 LAB — COLOGUARD

## 2014-01-19 LAB — COLOGUARD: COLOGUARD: NEGATIVE

## 2014-02-12 ENCOUNTER — Encounter: Payer: Self-pay | Admitting: Internal Medicine

## 2014-10-30 ENCOUNTER — Encounter: Payer: Self-pay | Admitting: Internal Medicine

## 2014-11-17 ENCOUNTER — Telehealth: Payer: Self-pay

## 2014-11-17 NOTE — Telephone Encounter (Signed)
Call to the home and spoke with the spouse; explained [place] and requested the patient call when available to direct number [phone] and I will given him more information. Apt scheduled on 10/19 at 1pm with dr. Jenny Reichmann.  Would prefer separate apt if possible

## 2014-11-17 NOTE — Telephone Encounter (Signed)
Call back from the patient and declined AWV as he has little needs and see's Dr. Jenny Reichmann one time per year. Educated on his o/d screens; including colonoscopy, shingles, PSV 23 and flu; and Hep c; States he takes nothing that medicare does not pay for. Is doing the cologuard this year and did speak with GI last year

## 2014-11-24 ENCOUNTER — Other Ambulatory Visit (INDEPENDENT_AMBULATORY_CARE_PROVIDER_SITE_OTHER): Payer: Medicare HMO

## 2014-11-24 DIAGNOSIS — Z0000 Encounter for general adult medical examination without abnormal findings: Secondary | ICD-10-CM

## 2014-11-24 DIAGNOSIS — Z125 Encounter for screening for malignant neoplasm of prostate: Secondary | ICD-10-CM

## 2014-11-24 DIAGNOSIS — E785 Hyperlipidemia, unspecified: Secondary | ICD-10-CM | POA: Diagnosis not present

## 2014-11-24 DIAGNOSIS — E039 Hypothyroidism, unspecified: Secondary | ICD-10-CM

## 2014-11-24 LAB — BASIC METABOLIC PANEL
BUN: 15 mg/dL (ref 6–23)
CALCIUM: 9.8 mg/dL (ref 8.4–10.5)
CO2: 25 meq/L (ref 19–32)
CREATININE: 1.14 mg/dL (ref 0.40–1.50)
Chloride: 105 mEq/L (ref 96–112)
GFR: 68.2 mL/min (ref >60.00)
Glucose, Bld: 92 mg/dL (ref 70–99)
Potassium: 4.8 mEq/L (ref 3.5–5.1)
SODIUM: 141 meq/L (ref 135–145)

## 2014-11-24 LAB — URINALYSIS, ROUTINE W REFLEX MICROSCOPIC
BILIRUBIN URINE: NEGATIVE
HGB URINE DIPSTICK: NEGATIVE
Ketones, ur: NEGATIVE
LEUKOCYTES UA: NEGATIVE
Nitrite: NEGATIVE
PH: 7 (ref 5.0–8.0)
RBC / HPF: NONE SEEN (ref 0–?)
Specific Gravity, Urine: <=1.005 — AB (ref 1.000–1.030)
TOTAL PROTEIN, URINE-UPE24: NEGATIVE
UROBILINOGEN UA: 0.2 (ref 0.0–1.0)
Urine Glucose: NEGATIVE

## 2014-11-24 LAB — CBC WITH DIFFERENTIAL/PLATELET
BASOS ABS: 0.1 10*3/uL (ref 0.0–0.1)
BASOS PCT: 0.9 % (ref 0.0–3.0)
EOS PCT: 4.4 % (ref 0.0–5.0)
Eosinophils Absolute: 0.3 10*3/uL (ref 0.0–0.7)
HEMATOCRIT: 47.2 % (ref 39.0–52.0)
Hemoglobin: 15.9 g/dL (ref 13.0–17.0)
LYMPHS ABS: 2.1 10*3/uL (ref 0.7–4.0)
LYMPHS PCT: 30.5 % (ref 12.0–46.0)
MCHC: 33.8 g/dL (ref 30.0–36.0)
MCV: 91.2 fl (ref 78.0–100.0)
MONOS PCT: 7.6 % (ref 3.0–12.0)
Monocytes Absolute: 0.5 10*3/uL (ref 0.1–1.0)
NEUTROS ABS: 3.9 10*3/uL (ref 1.4–7.7)
NEUTROS PCT: 56.6 % (ref 43.0–77.0)
PLATELETS: 178 10*3/uL (ref 150.0–400.0)
RBC: 5.18 Mil/uL (ref 4.22–5.81)
RDW: 13.4 % (ref 11.5–15.5)
WBC: 6.8 10*3/uL (ref 4.0–10.5)

## 2014-11-24 LAB — LIPID PANEL
CHOL/HDL RATIO: 6
Cholesterol: 161 mg/dL (ref 0–200)
HDL: 28.6 mg/dL — AB (ref >39.00)
NONHDL: 131.94
TRIGLYCERIDES: 253 mg/dL — AB (ref 0.0–149.0)
VLDL: 50.6 mg/dL — AB (ref 0.0–40.0)

## 2014-11-24 LAB — HEPATIC FUNCTION PANEL
ALK PHOS: 109 U/L (ref 39–117)
ALT: 31 U/L (ref 0–53)
AST: 22 U/L (ref 0–37)
Albumin: 4.3 g/dL (ref 3.5–5.2)
BILIRUBIN DIRECT: 0.2 mg/dL (ref 0.0–0.3)
BILIRUBIN TOTAL: 1.1 mg/dL (ref 0.2–1.2)
Total Protein: 6.9 g/dL (ref 6.0–8.3)

## 2014-11-24 LAB — TSH: TSH: 7.31 u[IU]/mL — ABNORMAL HIGH (ref 0.35–4.50)

## 2014-11-24 LAB — PSA: PSA: 0.36 ng/mL (ref 0.10–4.00)

## 2014-11-24 LAB — LDL CHOLESTEROL, DIRECT: LDL DIRECT: 93 mg/dL

## 2014-12-02 ENCOUNTER — Encounter: Payer: Self-pay | Admitting: Internal Medicine

## 2014-12-02 ENCOUNTER — Ambulatory Visit (INDEPENDENT_AMBULATORY_CARE_PROVIDER_SITE_OTHER): Payer: Medicare HMO | Admitting: Internal Medicine

## 2014-12-02 VITALS — BP 150/80 | HR 77 | Ht 73.0 in | Wt 241.0 lb

## 2014-12-02 DIAGNOSIS — Z23 Encounter for immunization: Secondary | ICD-10-CM

## 2014-12-02 DIAGNOSIS — E039 Hypothyroidism, unspecified: Secondary | ICD-10-CM

## 2014-12-02 DIAGNOSIS — Z0000 Encounter for general adult medical examination without abnormal findings: Secondary | ICD-10-CM

## 2014-12-02 DIAGNOSIS — I10 Essential (primary) hypertension: Secondary | ICD-10-CM | POA: Diagnosis not present

## 2014-12-02 MED ORDER — BENAZEPRIL HCL 40 MG PO TABS: 40.0000 mg | ORAL_TABLET | Freq: Every day | ORAL | Status: DC

## 2014-12-02 MED ORDER — AMLODIPINE BESYLATE 5 MG PO TABS: 5.0000 mg | ORAL_TABLET | Freq: Every day | ORAL | Status: DC

## 2014-12-02 MED ORDER — ATORVASTATIN CALCIUM 40 MG PO TABS: 40.0000 mg | ORAL_TABLET | Freq: Every day | ORAL | Status: DC

## 2014-12-02 MED ORDER — LEVOTHYROXINE SODIUM 100 MCG PO TABS
100.0000 ug | ORAL_TABLET | Freq: Every day | ORAL | Status: DC
Start: 2014-12-02 — End: 2015-11-09

## 2014-12-02 NOTE — Assessment & Plan Note (Signed)
With mild persistent elev TSH - for incrased levothyroxine to 100 qd, with f/u labs at 4 wks

## 2014-12-02 NOTE — Patient Instructions (Addendum)
You had the flu shot today, and the Pneumovax shots today  Your EKG was OK today  Ok to stop the levothryoxine 75 per day  Please take all new medication as prescribed - the levothryoxine at 100 mcg per day  Please return to LAB ONLY in 4 wks for repeat thyroid testing  Please continue all other medications as before, and refills have been done if requested.  Please have the pharmacy call with any other refills you may need.  Please continue your efforts at being more active, low cholesterol diet, and weight control.  You are otherwise up to date with prevention measures today.  Please keep your appointments with your specialists as you may have planned  Please return in 6 months, or sooner if needed

## 2014-12-02 NOTE — Assessment & Plan Note (Signed)
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
## 2014-12-02 NOTE — Progress Notes (Signed)
Pre visit review using our clinic review tool, if applicable. No additional management support is needed unless otherwise documented below in the visit note. 

## 2014-12-02 NOTE — Assessment & Plan Note (Signed)
Mild elev today, but stable overall by history and exam, recent data reviewed with pt, and pt to continue medical treatment as before,  to f/u any worsening symptoms or concerns 

## 2014-12-08 ENCOUNTER — Encounter: Payer: Self-pay | Admitting: Internal Medicine

## 2014-12-08 DIAGNOSIS — Z20828 Contact with and (suspected) exposure to other viral communicable diseases: Secondary | ICD-10-CM

## 2014-12-29 ENCOUNTER — Encounter: Payer: Self-pay | Admitting: Internal Medicine

## 2015-01-15 ENCOUNTER — Other Ambulatory Visit (INDEPENDENT_AMBULATORY_CARE_PROVIDER_SITE_OTHER): Payer: Medicare HMO

## 2015-01-15 DIAGNOSIS — E039 Hypothyroidism, unspecified: Secondary | ICD-10-CM | POA: Diagnosis not present

## 2015-01-15 LAB — T4, FREE: FREE T4: 1.08 ng/dL (ref 0.60–1.60)

## 2015-01-15 LAB — TSH: TSH: 3.88 u[IU]/mL (ref 0.35–4.50)

## 2015-06-24 ENCOUNTER — Encounter: Payer: Self-pay | Admitting: Internal Medicine

## 2015-10-12 ENCOUNTER — Other Ambulatory Visit: Payer: Self-pay | Admitting: Internal Medicine

## 2015-11-02 ENCOUNTER — Other Ambulatory Visit (INDEPENDENT_AMBULATORY_CARE_PROVIDER_SITE_OTHER): Payer: Medicare HMO

## 2015-11-02 DIAGNOSIS — R7989 Other specified abnormal findings of blood chemistry: Secondary | ICD-10-CM | POA: Diagnosis not present

## 2015-11-02 DIAGNOSIS — Z20828 Contact with and (suspected) exposure to other viral communicable diseases: Secondary | ICD-10-CM

## 2015-11-02 DIAGNOSIS — Z0000 Encounter for general adult medical examination without abnormal findings: Secondary | ICD-10-CM | POA: Diagnosis not present

## 2015-11-02 LAB — URINALYSIS, ROUTINE W REFLEX MICROSCOPIC
Bilirubin Urine: NEGATIVE
Hgb urine dipstick: NEGATIVE
KETONES UR: NEGATIVE
Leukocytes, UA: NEGATIVE
Nitrite: NEGATIVE
PH: 7.5 (ref 5.0–8.0)
RBC / HPF: NONE SEEN (ref 0–?)
Total Protein, Urine: NEGATIVE
URINE GLUCOSE: NEGATIVE
UROBILINOGEN UA: 0.2 (ref 0.0–1.0)

## 2015-11-02 LAB — BASIC METABOLIC PANEL
BUN: 17 mg/dL (ref 6–23)
CHLORIDE: 105 meq/L (ref 96–112)
CO2: 28 meq/L (ref 19–32)
Calcium: 9.3 mg/dL (ref 8.4–10.5)
Creatinine, Ser: 1.26 mg/dL (ref 0.40–1.50)
GFR: 60.58 mL/min (ref >60.00)
Glucose, Bld: 86 mg/dL (ref 70–99)
POTASSIUM: 4.4 meq/L (ref 3.5–5.1)
Sodium: 139 mEq/L (ref 135–145)

## 2015-11-02 LAB — CBC WITH DIFFERENTIAL/PLATELET
BASOS ABS: 0.1 10*3/uL (ref 0.0–0.1)
Basophils Relative: 0.9 % (ref 0.0–3.0)
EOS ABS: 0.3 10*3/uL (ref 0.0–0.7)
Eosinophils Relative: 4.3 % (ref 0.0–5.0)
HCT: 45.3 % (ref 39.0–52.0)
Hemoglobin: 15.6 g/dL (ref 13.0–17.0)
LYMPHS ABS: 1.9 10*3/uL (ref 0.7–4.0)
Lymphocytes Relative: 28.1 % (ref 12.0–46.0)
MCHC: 34.5 g/dL (ref 30.0–36.0)
MCV: 90.2 fl (ref 78.0–100.0)
MONO ABS: 0.5 10*3/uL (ref 0.1–1.0)
Monocytes Relative: 7.2 % (ref 3.0–12.0)
NEUTROS ABS: 4.1 10*3/uL (ref 1.4–7.7)
NEUTROS PCT: 59.5 % (ref 43.0–77.0)
PLATELETS: 171 10*3/uL (ref 150.0–400.0)
RBC: 5.02 Mil/uL (ref 4.22–5.81)
RDW: 13.4 % (ref 11.5–15.5)
WBC: 6.9 10*3/uL (ref 4.0–10.5)

## 2015-11-02 LAB — HEPATIC FUNCTION PANEL
ALK PHOS: 107 U/L (ref 39–117)
ALT: 24 U/L (ref 0–53)
AST: 14 U/L (ref 0–37)
Albumin: 4.2 g/dL (ref 3.5–5.2)
BILIRUBIN DIRECT: 0.1 mg/dL (ref 0.0–0.3)
TOTAL PROTEIN: 6.8 g/dL (ref 6.0–8.3)
Total Bilirubin: 0.9 mg/dL (ref 0.2–1.2)

## 2015-11-02 LAB — LIPID PANEL
Cholesterol: 151 mg/dL (ref 0–200)
HDL: 27 mg/dL — ABNORMAL LOW (ref >39.00)
NonHDL: 123.77
Total CHOL/HDL Ratio: 6
Triglycerides: 214 mg/dL — ABNORMAL HIGH (ref 0.0–149.0)
VLDL: 42.8 mg/dL — AB (ref 0.0–40.0)

## 2015-11-02 LAB — TSH: TSH: 6.39 u[IU]/mL — ABNORMAL HIGH (ref 0.35–4.50)

## 2015-11-02 LAB — PSA: PSA: 0.51 ng/mL (ref 0.10–4.00)

## 2015-11-02 LAB — LDL CHOLESTEROL, DIRECT: LDL DIRECT: 94 mg/dL

## 2015-11-03 LAB — HEPATITIS C ANTIBODY: HCV Ab: NEGATIVE

## 2015-11-09 ENCOUNTER — Encounter: Payer: Self-pay | Admitting: Internal Medicine

## 2015-11-09 ENCOUNTER — Ambulatory Visit (INDEPENDENT_AMBULATORY_CARE_PROVIDER_SITE_OTHER): Payer: Commercial Managed Care - HMO | Admitting: Internal Medicine

## 2015-11-09 VITALS — BP 128/82 | HR 94 | Temp 98.0°F | Resp 20 | Wt 241.0 lb

## 2015-11-09 DIAGNOSIS — M25562 Pain in left knee: Secondary | ICD-10-CM | POA: Diagnosis not present

## 2015-11-09 DIAGNOSIS — Z23 Encounter for immunization: Secondary | ICD-10-CM

## 2015-11-09 DIAGNOSIS — F411 Generalized anxiety disorder: Secondary | ICD-10-CM | POA: Diagnosis not present

## 2015-11-09 DIAGNOSIS — L719 Rosacea, unspecified: Secondary | ICD-10-CM

## 2015-11-09 DIAGNOSIS — Z0001 Encounter for general adult medical examination with abnormal findings: Secondary | ICD-10-CM | POA: Diagnosis not present

## 2015-11-09 DIAGNOSIS — R6889 Other general symptoms and signs: Secondary | ICD-10-CM | POA: Diagnosis not present

## 2015-11-09 DIAGNOSIS — M25561 Pain in right knee: Secondary | ICD-10-CM | POA: Diagnosis not present

## 2015-11-09 HISTORY — DX: Generalized anxiety disorder: F41.1

## 2015-11-09 MED ORDER — DOXYCYCLINE MONOHYDRATE 100 MG PO CAPS: 100.0000 mg | ORAL_CAPSULE | Freq: Two times a day (BID) | ORAL | 0 refills | Status: AC | PRN

## 2015-11-09 MED ORDER — ATORVASTATIN CALCIUM 40 MG PO TABS: 40.0000 mg | ORAL_TABLET | Freq: Every day | ORAL | 3 refills | Status: DC

## 2015-11-09 MED ORDER — LEVOTHYROXINE SODIUM 112 MCG PO TABS: 112.0000 ug | ORAL_TABLET | Freq: Every day | ORAL | 3 refills | Status: DC

## 2015-11-09 MED ORDER — BENAZEPRIL HCL 40 MG PO TABS: 40.0000 mg | ORAL_TABLET | Freq: Every day | ORAL | 3 refills | Status: DC

## 2015-11-09 NOTE — Assessment & Plan Note (Signed)
C/w djd, for wt loss, tylenol prn, declines sport med or ortho referral

## 2015-11-09 NOTE — Progress Notes (Signed)
Pre visit review using our clinic review tool, if applicable. No additional management support is needed unless otherwise documented below in the visit note. 

## 2015-11-09 NOTE — Assessment & Plan Note (Signed)

## 2015-11-09 NOTE — Assessment & Plan Note (Addendum)
Has done well with prn doxycycline, ok for refill  In addition to the time spent performing CPE, I spent an additional 15 minutes face to face,in which greater than 50% of this time was spent in counseling and coordination of care for patient's illness as documented.

## 2015-11-09 NOTE — Patient Instructions (Addendum)
You had the flu shot today  Please continue all other medications as before, and refills have been done if requested.  Please have the pharmacy call with any other refills you may need.  Please continue your efforts at being more active, low cholesterol diet, and weight control.  You are otherwise up to date with prevention measures today.  Please keep your appointments with your specialists as you may have planned  You will be contacted regarding the referral for: psychology for the service animal purpose  You are given the handicap parking application today  Please return in 1 year for your yearly visit, or sooner if needed, with Lab testing done 3-5 days before
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
## 2015-11-09 NOTE — Assessment & Plan Note (Signed)
[place] for referral to counseling for consideration for service animal

## 2015-12-06 ENCOUNTER — Encounter: Payer: Self-pay | Admitting: Internal Medicine

## 2015-12-06 NOTE — Telephone Encounter (Signed)
Ok to forgive the sick visit code, keep the preventive billing, thanks

## 2016-04-26 DIAGNOSIS — H5203 Hypermetropia, bilateral: Secondary | ICD-10-CM | POA: Diagnosis not present

## 2016-10-20 ENCOUNTER — Other Ambulatory Visit: Payer: Medicare HMO

## 2016-10-20 ENCOUNTER — Telehealth: Payer: Self-pay

## 2016-10-20 DIAGNOSIS — Z0000 Encounter for general adult medical examination without abnormal findings: Secondary | ICD-10-CM

## 2016-10-24 ENCOUNTER — Encounter: Payer: Self-pay | Admitting: Internal Medicine

## 2016-11-06 ENCOUNTER — Encounter: Payer: Self-pay | Admitting: Internal Medicine

## 2016-11-06 NOTE — Telephone Encounter (Signed)
Lucy, I think pt means he had labs done recently but no results on chart  I see the orders for that day  Can we contact lab about this? thanks

## 2016-11-09 ENCOUNTER — Encounter: Payer: Self-pay | Admitting: Internal Medicine

## 2016-11-09 ENCOUNTER — Other Ambulatory Visit (INDEPENDENT_AMBULATORY_CARE_PROVIDER_SITE_OTHER): Payer: Medicare HMO

## 2016-11-09 ENCOUNTER — Ambulatory Visit (INDEPENDENT_AMBULATORY_CARE_PROVIDER_SITE_OTHER): Payer: Medicare HMO | Admitting: Internal Medicine

## 2016-11-09 VITALS — BP 142/98 | HR 73 | Temp 98.3°F | Ht 73.0 in | Wt 248.0 lb

## 2016-11-09 DIAGNOSIS — Z0000 Encounter for general adult medical examination without abnormal findings: Secondary | ICD-10-CM

## 2016-11-09 DIAGNOSIS — Z23 Encounter for immunization: Secondary | ICD-10-CM | POA: Diagnosis not present

## 2016-11-09 LAB — CBC WITH DIFFERENTIAL/PLATELET
BASOS PCT: 1.3 % (ref 0.0–3.0)
Basophils Absolute: 0.1 10*3/uL (ref 0.0–0.1)
EOS PCT: 5.5 % — AB (ref 0.0–5.0)
Eosinophils Absolute: 0.4 10*3/uL (ref 0.0–0.7)
HCT: 43.8 % (ref 39.0–52.0)
HEMOGLOBIN: 14.9 g/dL (ref 13.0–17.0)
Lymphocytes Relative: 25.9 % (ref 12.0–46.0)
Lymphs Abs: 1.9 10*3/uL (ref 0.7–4.0)
MCHC: 34 g/dL (ref 30.0–36.0)
MCV: 91.6 fl (ref 78.0–100.0)
Monocytes Absolute: 0.5 10*3/uL (ref 0.1–1.0)
Monocytes Relative: 6.6 % (ref 3.0–12.0)
NEUTROS ABS: 4.5 10*3/uL (ref 1.4–7.7)
Neutrophils Relative %: 60.7 % (ref 43.0–77.0)
Platelets: 177 10*3/uL (ref 150.0–400.0)
RBC: 4.78 Mil/uL (ref 4.22–5.81)
RDW: 14 % (ref 11.5–15.5)
WBC: 7.4 10*3/uL (ref 4.0–10.5)

## 2016-11-09 LAB — URINALYSIS, ROUTINE W REFLEX MICROSCOPIC
BILIRUBIN URINE: NEGATIVE
HGB URINE DIPSTICK: NEGATIVE
KETONES UR: NEGATIVE
LEUKOCYTES UA: NEGATIVE
NITRITE: NEGATIVE
RBC / HPF: NONE SEEN (ref 0–?)
Specific Gravity, Urine: 1.025 (ref 1.000–1.030)
Total Protein, Urine: NEGATIVE
URINE GLUCOSE: NEGATIVE
UROBILINOGEN UA: 1 (ref 0.0–1.0)
pH: 5.5 (ref 5.0–8.0)

## 2016-11-09 LAB — LIPID PANEL
CHOLESTEROL: 163 mg/dL (ref 0–200)
HDL: 28 mg/dL — AB (ref >39.00)
NonHDL: 135.31
TRIGLYCERIDES: 347 mg/dL — AB (ref 0.0–149.0)
Total CHOL/HDL Ratio: 6
VLDL: 69.4 mg/dL — ABNORMAL HIGH (ref 0.0–40.0)

## 2016-11-09 LAB — PSA: PSA: 0.41 ng/mL (ref 0.10–4.00)

## 2016-11-09 LAB — BASIC METABOLIC PANEL
BUN: 15 mg/dL (ref 6–23)
CHLORIDE: 106 meq/L (ref 96–112)
CO2: 27 mEq/L (ref 19–32)
Calcium: 9.5 mg/dL (ref 8.4–10.5)
Creatinine, Ser: 1.22 mg/dL (ref 0.40–1.50)
GFR: 62.69 mL/min (ref >60.00)
Glucose, Bld: 96 mg/dL (ref 70–99)
POTASSIUM: 4.1 meq/L (ref 3.5–5.1)
SODIUM: 140 meq/L (ref 135–145)

## 2016-11-09 LAB — HEPATIC FUNCTION PANEL
ALK PHOS: 86 U/L (ref 39–117)
ALT: 20 U/L (ref 0–53)
AST: 16 U/L (ref 0–37)
Albumin: 4.3 g/dL (ref 3.5–5.2)
BILIRUBIN DIRECT: 0.1 mg/dL (ref 0.0–0.3)
BILIRUBIN TOTAL: 0.9 mg/dL (ref 0.2–1.2)
Total Protein: 6.6 g/dL (ref 6.0–8.3)

## 2016-11-09 LAB — LDL CHOLESTEROL, DIRECT: Direct LDL: 88 mg/dL

## 2016-11-09 LAB — TSH: TSH: 3.93 u[IU]/mL (ref 0.35–4.50)

## 2016-11-09 MED ORDER — LEVOTHYROXINE SODIUM 112 MCG PO TABS
112.0000 ug | ORAL_TABLET | Freq: Every day | ORAL | 3 refills | Status: DC
Start: 2016-11-09 — End: 2017-11-22

## 2016-11-09 MED ORDER — ATORVASTATIN CALCIUM 40 MG PO TABS: 40.0000 mg | ORAL_TABLET | Freq: Every day | ORAL | 3 refills | Status: DC

## 2016-11-09 MED ORDER — AMLODIPINE BESYLATE 5 MG PO TABS: 5.0000 mg | ORAL_TABLET | Freq: Every day | ORAL | 3 refills | Status: DC

## 2016-11-09 MED ORDER — BENAZEPRIL HCL 40 MG PO TABS: 40.0000 mg | ORAL_TABLET | Freq: Every day | ORAL | 3 refills | Status: DC

## 2016-11-09 NOTE — Patient Instructions (Addendum)
You had the flu shot today  We will ask [facility] to sign you up for the repeat Cologuard  Please continue all other medications as before, and refills have been done if requested.  Please have the pharmacy call with any other refills you may need.  Please continue your efforts at being more active, low cholesterol diet, and weight control.  You are otherwise up to date with prevention measures today.  Please keep your appointments with your specialists as you may have planned  Please go to the LAB in the Basement (turn left off the elevator) for the tests to be done today  You will be contacted by phone if any changes need to be made immediately.  Otherwise, you will receive a letter about your results with an explanation, but please check with MyChart first.  Please remember to sign up for MyChart if you have not done so, as this will be important to you in the future with finding out test results, communicating by private email, and scheduling acute appointments online when needed.  Please return in 1 year for your yearly visit, or sooner if needed, with Lab testing done 3-5 days before
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
## 2016-11-11 NOTE — Assessment & Plan Note (Signed)

## 2016-12-12 ENCOUNTER — Encounter: Payer: Self-pay | Admitting: Internal Medicine

## 2017-09-18 ENCOUNTER — Telehealth: Payer: Self-pay | Admitting: Emergency Medicine

## 2017-09-18 NOTE — Telephone Encounter (Signed)
Called patient to schedule AWV. Pt can be scheduled at anytime.

## 2017-10-04 DIAGNOSIS — H40013 Open angle with borderline findings, low risk, bilateral: Secondary | ICD-10-CM | POA: Diagnosis not present

## 2017-10-04 DIAGNOSIS — I10 Essential (primary) hypertension: Secondary | ICD-10-CM | POA: Diagnosis not present

## 2017-10-04 DIAGNOSIS — H524 Presbyopia: Secondary | ICD-10-CM | POA: Diagnosis not present

## 2017-11-14 ENCOUNTER — Encounter: Payer: Self-pay | Admitting: Internal Medicine

## 2017-11-19 ENCOUNTER — Other Ambulatory Visit (INDEPENDENT_AMBULATORY_CARE_PROVIDER_SITE_OTHER): Payer: Medicare HMO

## 2017-11-19 DIAGNOSIS — Z0000 Encounter for general adult medical examination without abnormal findings: Secondary | ICD-10-CM

## 2017-11-19 LAB — HEPATIC FUNCTION PANEL
ALT: 21 U/L (ref 0–53)
AST: 16 U/L (ref 0–37)
Albumin: 4.3 g/dL (ref 3.5–5.2)
Alkaline Phosphatase: 100 U/L (ref 39–117)
Bilirubin, Direct: 0.2 mg/dL (ref 0.0–0.3)
Total Bilirubin: 1 mg/dL (ref 0.2–1.2)
Total Protein: 6.8 g/dL (ref 6.0–8.3)

## 2017-11-19 LAB — CBC WITH DIFFERENTIAL/PLATELET
BASOS PCT: 1.2 % (ref 0.0–3.0)
Basophils Absolute: 0.1 10*3/uL (ref 0.0–0.1)
EOS PCT: 4.2 % (ref 0.0–5.0)
Eosinophils Absolute: 0.3 10*3/uL (ref 0.0–0.7)
HCT: 44.9 % (ref 39.0–52.0)
HEMOGLOBIN: 15.5 g/dL (ref 13.0–17.0)
Lymphocytes Relative: 29.5 % (ref 12.0–46.0)
Lymphs Abs: 1.8 10*3/uL (ref 0.7–4.0)
MCHC: 34.4 g/dL (ref 30.0–36.0)
MCV: 91.4 fl (ref 78.0–100.0)
MONO ABS: 0.4 10*3/uL (ref 0.1–1.0)
MONOS PCT: 7 % (ref 3.0–12.0)
Neutro Abs: 3.6 10*3/uL (ref 1.4–7.7)
Neutrophils Relative %: 58.1 % (ref 43.0–77.0)
Platelets: 170 10*3/uL (ref 150.0–400.0)
RBC: 4.91 Mil/uL (ref 4.22–5.81)
RDW: 13.5 % (ref 11.5–15.5)
WBC: 6.2 10*3/uL (ref 4.0–10.5)

## 2017-11-19 LAB — LIPID PANEL
Cholesterol: 152 mg/dL (ref 0–200)
HDL: 25.7 mg/dL — AB (ref >39.00)
NonHDL: 126.29
TRIGLYCERIDES: 231 mg/dL — AB (ref 0.0–149.0)
Total CHOL/HDL Ratio: 6
VLDL: 46.2 mg/dL — AB (ref 0.0–40.0)

## 2017-11-19 LAB — URINALYSIS, ROUTINE W REFLEX MICROSCOPIC
Bilirubin Urine: NEGATIVE
Hgb urine dipstick: NEGATIVE
KETONES UR: NEGATIVE
Leukocytes, UA: NEGATIVE
Nitrite: NEGATIVE
PH: 6.5 (ref 5.0–8.0)
Total Protein, Urine: NEGATIVE
UROBILINOGEN UA: 0.2 (ref 0.0–1.0)
Urine Glucose: NEGATIVE

## 2017-11-19 LAB — BASIC METABOLIC PANEL
BUN: 16 mg/dL (ref 6–23)
CALCIUM: 9.7 mg/dL (ref 8.4–10.5)
CO2: 25 mEq/L (ref 19–32)
Chloride: 105 mEq/L (ref 96–112)
Creatinine, Ser: 1.19 mg/dL (ref 0.40–1.50)
GFR: 64.32 mL/min (ref >60.00)
GLUCOSE: 102 mg/dL — AB (ref 70–99)
POTASSIUM: 4 meq/L (ref 3.5–5.1)
SODIUM: 138 meq/L (ref 135–145)

## 2017-11-19 LAB — TSH: TSH: 4.27 u[IU]/mL (ref 0.35–4.50)

## 2017-11-19 LAB — PSA: PSA: 0.39 ng/mL (ref 0.10–4.00)

## 2017-11-19 LAB — LDL CHOLESTEROL, DIRECT: LDL DIRECT: 89 mg/dL

## 2017-11-22 ENCOUNTER — Ambulatory Visit: Payer: Medicare HMO

## 2017-11-22 ENCOUNTER — Ambulatory Visit (INDEPENDENT_AMBULATORY_CARE_PROVIDER_SITE_OTHER): Payer: Medicare HMO | Admitting: Internal Medicine

## 2017-11-22 ENCOUNTER — Encounter: Payer: Self-pay | Admitting: Internal Medicine

## 2017-11-22 VITALS — BP 124/86 | HR 74 | Temp 97.6°F | Ht 73.0 in | Wt 242.0 lb

## 2017-11-22 DIAGNOSIS — I10 Essential (primary) hypertension: Secondary | ICD-10-CM | POA: Diagnosis not present

## 2017-11-22 DIAGNOSIS — Z23 Encounter for immunization: Secondary | ICD-10-CM

## 2017-11-22 DIAGNOSIS — Z0000 Encounter for general adult medical examination without abnormal findings: Secondary | ICD-10-CM

## 2017-11-22 MED ORDER — BENAZEPRIL HCL 40 MG PO TABS: 40.0000 mg | ORAL_TABLET | Freq: Every day | ORAL | 3 refills | Status: DC

## 2017-11-22 MED ORDER — ATORVASTATIN CALCIUM 40 MG PO TABS: 40.0000 mg | ORAL_TABLET | Freq: Every day | ORAL | 3 refills | Status: DC

## 2017-11-22 MED ORDER — AMLODIPINE BESYLATE 5 MG PO TABS: 5.0000 mg | ORAL_TABLET | Freq: Every day | ORAL | 3 refills | Status: DC

## 2017-11-22 MED ORDER — LEVOTHYROXINE SODIUM 112 MCG PO TABS: 112.0000 ug | ORAL_TABLET | Freq: Every day | ORAL | 3 refills | Status: DC

## 2017-11-22 NOTE — Patient Instructions (Addendum)
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
## 2017-11-24 ENCOUNTER — Encounter: Payer: Self-pay | Admitting: Internal Medicine

## 2017-11-24 NOTE — Assessment & Plan Note (Signed)
stable overall by history and exam, recent data reviewed with pt, and pt to continue medical treatment as before,  to f/u any worsening symptoms or concerns  

## 2017-11-24 NOTE — Assessment & Plan Note (Signed)

## 2018-11-21 ENCOUNTER — Other Ambulatory Visit (INDEPENDENT_AMBULATORY_CARE_PROVIDER_SITE_OTHER): Payer: Medicare HMO

## 2018-11-21 DIAGNOSIS — Z0000 Encounter for general adult medical examination without abnormal findings: Secondary | ICD-10-CM | POA: Diagnosis not present

## 2018-11-21 LAB — CBC WITH DIFFERENTIAL/PLATELET
Basophils Absolute: 0.1 10*3/uL (ref 0.0–0.1)
Basophils Relative: 1.2 % (ref 0.0–3.0)
Eosinophils Absolute: 0.2 10*3/uL (ref 0.0–0.7)
Eosinophils Relative: 3.9 % (ref 0.0–5.0)
HCT: 43.7 % (ref 39.0–52.0)
Hemoglobin: 14.7 g/dL (ref 13.0–17.0)
Lymphocytes Relative: 31.9 % (ref 12.0–46.0)
Lymphs Abs: 1.8 10*3/uL (ref 0.7–4.0)
MCHC: 33.7 g/dL (ref 30.0–36.0)
MCV: 92.3 fl (ref 78.0–100.0)
Monocytes Absolute: 0.4 10*3/uL (ref 0.1–1.0)
Monocytes Relative: 6.8 % (ref 3.0–12.0)
Neutro Abs: 3.2 10*3/uL (ref 1.4–7.7)
Neutrophils Relative %: 56.2 % (ref 43.0–77.0)
Platelets: 180 10*3/uL (ref 150.0–400.0)
RBC: 4.74 Mil/uL (ref 4.22–5.81)
RDW: 13.7 % (ref 11.5–15.5)
WBC: 5.7 10*3/uL (ref 4.0–10.5)

## 2018-11-21 LAB — HEPATIC FUNCTION PANEL
ALT: 28 U/L (ref 0–53)
AST: 19 U/L (ref 0–37)
Albumin: 4.3 g/dL (ref 3.5–5.2)
Alkaline Phosphatase: 120 U/L — ABNORMAL HIGH (ref 39–117)
Bilirubin, Direct: 0.2 mg/dL (ref 0.0–0.3)
Total Bilirubin: 1.1 mg/dL (ref 0.2–1.2)
Total Protein: 6.7 g/dL (ref 6.0–8.3)

## 2018-11-21 LAB — URINALYSIS, ROUTINE W REFLEX MICROSCOPIC
Bilirubin Urine: NEGATIVE
Hgb urine dipstick: NEGATIVE
Ketones, ur: NEGATIVE
Leukocytes,Ua: NEGATIVE
Nitrite: NEGATIVE
RBC / HPF: NONE SEEN (ref 0–?)
Specific Gravity, Urine: 1.01 (ref 1.000–1.030)
Total Protein, Urine: NEGATIVE
Urine Glucose: NEGATIVE
Urobilinogen, UA: 0.2 (ref 0.0–1.0)
pH: 7 (ref 5.0–8.0)

## 2018-11-21 LAB — BASIC METABOLIC PANEL
BUN: 15 mg/dL (ref 6–23)
CO2: 23 mEq/L (ref 19–32)
Calcium: 9.5 mg/dL (ref 8.4–10.5)
Chloride: 105 mEq/L (ref 96–112)
Creatinine, Ser: 1.23 mg/dL (ref 0.40–1.50)
GFR: 58.08 mL/min — ABNORMAL LOW (ref >60.00)
Glucose, Bld: 83 mg/dL (ref 70–99)
Potassium: 4.3 mEq/L (ref 3.5–5.1)
Sodium: 138 mEq/L (ref 135–145)

## 2018-11-21 LAB — LIPID PANEL
Cholesterol: 140 mg/dL (ref 0–200)
HDL: 26.6 mg/dL — ABNORMAL LOW (ref >39.00)
LDL Cholesterol: 75 mg/dL (ref 0–99)
NonHDL: 113.27
Total CHOL/HDL Ratio: 5
Triglycerides: 189 mg/dL — ABNORMAL HIGH (ref 0.0–149.0)
VLDL: 37.8 mg/dL (ref 0.0–40.0)

## 2018-11-21 LAB — TSH: TSH: 3.81 u[IU]/mL (ref 0.35–4.50)

## 2018-11-21 LAB — PSA: PSA: 0.5 ng/mL (ref 0.10–4.00)

## 2018-11-27 ENCOUNTER — Other Ambulatory Visit: Payer: Self-pay

## 2018-11-27 ENCOUNTER — Encounter: Payer: Self-pay | Admitting: Internal Medicine

## 2018-11-27 ENCOUNTER — Ambulatory Visit (INDEPENDENT_AMBULATORY_CARE_PROVIDER_SITE_OTHER): Payer: Medicare HMO | Admitting: Internal Medicine

## 2018-11-27 VITALS — Ht 73.0 in | Wt 243.0 lb

## 2018-11-27 DIAGNOSIS — E538 Deficiency of other specified B group vitamins: Secondary | ICD-10-CM

## 2018-11-27 DIAGNOSIS — E611 Iron deficiency: Secondary | ICD-10-CM

## 2018-11-27 DIAGNOSIS — I10 Essential (primary) hypertension: Secondary | ICD-10-CM | POA: Diagnosis not present

## 2018-11-27 DIAGNOSIS — Z23 Encounter for immunization: Secondary | ICD-10-CM | POA: Diagnosis not present

## 2018-11-27 DIAGNOSIS — E559 Vitamin D deficiency, unspecified: Secondary | ICD-10-CM | POA: Diagnosis not present

## 2018-11-27 DIAGNOSIS — Z0000 Encounter for general adult medical examination without abnormal findings: Secondary | ICD-10-CM | POA: Diagnosis not present

## 2018-11-27 MED ORDER — AMLODIPINE BESYLATE 5 MG PO TABS: 5.0000 mg | ORAL_TABLET | Freq: Every day | ORAL | 3 refills | Status: DC

## 2018-11-27 MED ORDER — ATORVASTATIN CALCIUM 40 MG PO TABS: 40.0000 mg | ORAL_TABLET | Freq: Every day | ORAL | 3 refills | Status: DC

## 2018-11-27 MED ORDER — LEVOTHYROXINE SODIUM 112 MCG PO TABS: 112.0000 ug | ORAL_TABLET | Freq: Every day | ORAL | 3 refills | Status: DC

## 2018-11-27 MED ORDER — BENAZEPRIL HCL 40 MG PO TABS: 40.0000 mg | ORAL_TABLET | Freq: Every day | ORAL | 3 refills | Status: DC
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
## 2018-11-27 NOTE — Patient Instructions (Signed)
You had the flu shot today, and the Tdap tetanus shot today  Please continue all other medications as before, and refills have been done if requested.  Please have the pharmacy call with any other refills you may need.  Please continue your efforts at being more active, low cholesterol diet, and weight control.  You are otherwise up to date with prevention measures today.  Please keep your appointments with your specialists as you may have planned  Please go to the LAB in the Basement (turn left off the elevator) for the tests to be done today  You will be contacted by phone if any changes need to be made immediately.  Otherwise, you will receive a letter about your results with an explanation, but please check with MyChart first.  Please remember to sign up for MyChart if you have not done so, as this will be important to you in the future with finding out test results, communicating by private email, and scheduling acute appointments online when needed.  Please return in 1 year for your yearly visit, or sooner if needed, with Lab testing done 3-5 days before

## 2018-11-27 NOTE — Progress Notes (Signed)
Vitals were not done due to patient not wearing an appropriate face covering. Mask had holes punched in it.

## 2018-11-30 ENCOUNTER — Encounter: Payer: Self-pay | Admitting: Internal Medicine

## 2018-11-30 NOTE — Assessment & Plan Note (Signed)

## 2018-11-30 NOTE — Assessment & Plan Note (Signed)
stable overall by history and exam, recent data reviewed with pt, and pt to continue medical treatment as before,  to f/u any worsening symptoms or concerns  

## 2018-12-23 ENCOUNTER — Encounter: Payer: Self-pay | Admitting: Internal Medicine

## 2019-03-04 ENCOUNTER — Other Ambulatory Visit: Payer: Self-pay

## 2019-03-04 ENCOUNTER — Ambulatory Visit (INDEPENDENT_AMBULATORY_CARE_PROVIDER_SITE_OTHER): Payer: Medicare HMO | Admitting: Internal Medicine

## 2019-03-04 ENCOUNTER — Ambulatory Visit: Payer: Medicare HMO

## 2019-03-04 ENCOUNTER — Ambulatory Visit (INDEPENDENT_AMBULATORY_CARE_PROVIDER_SITE_OTHER): Payer: Medicare HMO

## 2019-03-04 ENCOUNTER — Encounter: Payer: Self-pay | Admitting: Internal Medicine

## 2019-03-04 VITALS — BP 150/92 | HR 80 | Temp 97.9°F | Ht 73.0 in | Wt 239.0 lb

## 2019-03-04 DIAGNOSIS — E785 Hyperlipidemia, unspecified: Secondary | ICD-10-CM | POA: Diagnosis not present

## 2019-03-04 DIAGNOSIS — E039 Hypothyroidism, unspecified: Secondary | ICD-10-CM

## 2019-03-04 DIAGNOSIS — M25511 Pain in right shoulder: Secondary | ICD-10-CM

## 2019-03-04 DIAGNOSIS — I10 Essential (primary) hypertension: Secondary | ICD-10-CM | POA: Diagnosis not present

## 2019-03-04 DIAGNOSIS — M25512 Pain in left shoulder: Secondary | ICD-10-CM | POA: Diagnosis not present

## 2019-03-04 LAB — CBC WITH DIFFERENTIAL/PLATELET
Basophils Absolute: 0.1 10*3/uL (ref 0.0–0.1)
Basophils Relative: 1.1 % (ref 0.0–3.0)
Eosinophils Absolute: 0.3 10*3/uL (ref 0.0–0.7)
Eosinophils Relative: 4 % (ref 0.0–5.0)
HCT: 43.7 % (ref 39.0–52.0)
Hemoglobin: 14.5 g/dL (ref 13.0–17.0)
Lymphocytes Relative: 21.8 % (ref 12.0–46.0)
Lymphs Abs: 1.4 10*3/uL (ref 0.7–4.0)
MCHC: 33.3 g/dL (ref 30.0–36.0)
MCV: 91.4 fl (ref 78.0–100.0)
Monocytes Absolute: 0.5 10*3/uL (ref 0.1–1.0)
Monocytes Relative: 7.5 % (ref 3.0–12.0)
Neutro Abs: 4.3 10*3/uL (ref 1.4–7.7)
Neutrophils Relative %: 65.6 % (ref 43.0–77.0)
Platelets: 191 10*3/uL (ref 150.0–400.0)
RBC: 4.78 Mil/uL (ref 4.22–5.81)
RDW: 13.9 % (ref 11.5–15.5)
WBC: 6.5 10*3/uL (ref 4.0–10.5)

## 2019-03-04 LAB — BASIC METABOLIC PANEL
BUN: 16 mg/dL (ref 6–23)
CO2: 27 mEq/L (ref 19–32)
Calcium: 9.8 mg/dL (ref 8.4–10.5)
Chloride: 104 mEq/L (ref 96–112)
Creatinine, Ser: 1.14 mg/dL (ref 0.40–1.50)
GFR: 63.36 mL/min (ref >60.00)
Glucose, Bld: 87 mg/dL (ref 70–99)
Potassium: 4.8 mEq/L (ref 3.5–5.1)
Sodium: 139 mEq/L (ref 135–145)

## 2019-03-04 LAB — SEDIMENTATION RATE: Sed Rate: 5 mm/hr (ref 0–20)

## 2019-03-04 LAB — C-REACTIVE PROTEIN: CRP: <1 mg/dL (ref 0.5–20.0)

## 2019-03-04 MED ORDER — TRAMADOL HCL 50 MG PO TABS: 50.0000 mg | ORAL_TABLET | Freq: Four times a day (QID) | ORAL | 0 refills | Status: DC | PRN
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
## 2019-03-04 NOTE — Patient Instructions (Addendum)
Please take all new medication as prescribed - the tramadol for pain as needed  Please continue all other medications as before, and refills have been done if requested.  Please have the pharmacy call with any other refills you may need.  Please continue your efforts at being more active, low cholesterol diet, and weight control.  Please keep your appointments with your specialists as you may have planned  Please go to the XRAY Department in the first floor for the x-ray testing - the shoulder xrays  Please go to the LAB at the blood drawing area for the tests to be done - the inflammatory testing (if increased you may need rheumatology referral)  You will be contacted by phone if any changes need to be made immediately.  Otherwise, you will receive a letter about your results with an explanation, but please check with MyChart first.  Please remember to sign up for MyChart if you have not done so, as this will be important to you in the future with finding out test results, communicating by private email, and scheduling acute appointments online when needed.  Please make an Appointment to return in 9 months, or sooner if needed, also with Lab Appointment for testing done 3-5 days before at the [facility] (so this is for TWO appointments - please see the scheduling desk as you leave)

## 2019-03-05 ENCOUNTER — Encounter: Payer: Self-pay | Admitting: Internal Medicine

## 2019-03-05 DIAGNOSIS — M25511 Pain in right shoulder: Secondary | ICD-10-CM

## 2019-03-05 NOTE — Assessment & Plan Note (Signed)
Etiology unclear, for esr, crp, cbc and xrays, consider rheumatology vs sport med referrals.  I spent 35 minutes preparing to see the patient by review of recent labs, imaging and procedures, obtaining and reviewing separately obtained history, communicating with the patient and family or caregiver, ordering medications, tests or procedures, and documenting clinical information in the EHR including the differential Dx, treatment, and any further evaluation and other management of bilateral shoulder pain, HTN, HLD, hypothyoridism

## 2019-03-05 NOTE — Assessment & Plan Note (Signed)
BP at home < 140/90, delcines any med changes

## 2019-03-05 NOTE — Assessment & Plan Note (Signed)
stable overall by history and exam, recent data reviewed with pt, and pt to continue medical treatment as before,  to f/u any worsening symptoms or concerns  

## 2019-03-18 ENCOUNTER — Encounter: Payer: Self-pay | Admitting: Family Medicine

## 2019-03-18 ENCOUNTER — Ambulatory Visit: Payer: Self-pay

## 2019-03-18 ENCOUNTER — Other Ambulatory Visit: Payer: Self-pay

## 2019-03-18 ENCOUNTER — Ambulatory Visit: Payer: Medicare HMO | Admitting: Family Medicine

## 2019-03-18 VITALS — BP 138/88 | HR 87 | Ht 73.0 in | Wt 238.3 lb

## 2019-03-18 DIAGNOSIS — M7552 Bursitis of left shoulder: Secondary | ICD-10-CM | POA: Diagnosis not present

## 2019-03-18 DIAGNOSIS — M25512 Pain in left shoulder: Secondary | ICD-10-CM

## 2019-03-18 DIAGNOSIS — G5712 Meralgia paresthetica, left lower limb: Secondary | ICD-10-CM | POA: Diagnosis not present

## 2019-03-18 DIAGNOSIS — M7551 Bursitis of right shoulder: Secondary | ICD-10-CM | POA: Diagnosis not present

## 2019-03-18 DIAGNOSIS — M25511 Pain in right shoulder: Secondary | ICD-10-CM

## 2019-03-18 NOTE — Patient Instructions (Addendum)
Thank you for coming in today. Attend PT.  Work on exercises.  Recheck in 1 month.   For left thigh numbness I think the nerve in the front of the hip is getting pinched.  Try using suspenders instead of a belt.  We will re-evaluate this problem at recheck.   Could return sooner than 1 month for left skidded injection if not doing well in PT.   Please perform the exercise program that we have prepared for you and gone over in detail on a daily basis.  In addition to the handout you were provided you can access your program through: [email or web address]   Your unique program code is:  [id number]
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
## 2019-03-31 ENCOUNTER — Ambulatory Visit: Payer: Medicare HMO

## 2019-04-07 ENCOUNTER — Encounter: Payer: Self-pay | Admitting: Family Medicine

## 2019-04-07 MED ORDER — PREDNISONE 50 MG PO TABS: 50.0000 mg | ORAL_TABLET | Freq: Every day | ORAL | 0 refills | Status: DC

## 2019-04-15 ENCOUNTER — Other Ambulatory Visit (INDEPENDENT_AMBULATORY_CARE_PROVIDER_SITE_OTHER): Payer: Medicare HMO

## 2019-04-15 ENCOUNTER — Other Ambulatory Visit: Payer: Self-pay

## 2019-04-15 ENCOUNTER — Ambulatory Visit (INDEPENDENT_AMBULATORY_CARE_PROVIDER_SITE_OTHER): Payer: Medicare HMO | Admitting: Family Medicine

## 2019-04-15 VITALS — Wt 234.0 lb

## 2019-04-15 DIAGNOSIS — M25511 Pain in right shoulder: Secondary | ICD-10-CM | POA: Diagnosis not present

## 2019-04-15 DIAGNOSIS — E039 Hypothyroidism, unspecified: Secondary | ICD-10-CM

## 2019-04-15 DIAGNOSIS — M25512 Pain in left shoulder: Secondary | ICD-10-CM | POA: Diagnosis not present

## 2019-04-15 DIAGNOSIS — M791 Myalgia, unspecified site: Secondary | ICD-10-CM

## 2019-04-15 LAB — COMPREHENSIVE METABOLIC PANEL
ALT: 80 U/L — ABNORMAL HIGH (ref 0–53)
AST: 24 U/L (ref 0–37)
Albumin: 3.8 g/dL (ref 3.5–5.2)
Alkaline Phosphatase: 153 U/L — ABNORMAL HIGH (ref 39–117)
BUN: 18 mg/dL (ref 6–23)
CO2: 28 mEq/L (ref 19–32)
Calcium: 9.6 mg/dL (ref 8.4–10.5)
Chloride: 101 mEq/L (ref 96–112)
Creatinine, Ser: 1.19 mg/dL (ref 0.40–1.50)
GFR: 60.27 mL/min (ref >60.00)
Glucose, Bld: 85 mg/dL (ref 70–99)
Potassium: 3.8 mEq/L (ref 3.5–5.1)
Sodium: 136 mEq/L (ref 135–145)
Total Bilirubin: 1.3 mg/dL — ABNORMAL HIGH (ref 0.2–1.2)
Total Protein: 6.8 g/dL (ref 6.0–8.3)

## 2019-04-15 LAB — SEDIMENTATION RATE: Sed Rate: 11 mm/hr (ref 0–20)

## 2019-04-15 LAB — CK: Total CK: 22 U/L (ref 7–232)

## 2019-04-15 LAB — URIC ACID: Uric Acid, Serum: 6.2 mg/dL (ref 4.0–7.8)

## 2019-04-15 LAB — TSH: TSH: 4.44 u[IU]/mL (ref 0.35–4.50)

## 2019-04-15 MED ORDER — PREDNISONE 10 MG (48) PO TBPK: ORAL_TABLET | Freq: Every day | ORAL | 0 refills | Status: DC

## 2019-04-15 NOTE — Progress Notes (Signed)
Some labs are back already. Uric acid a measure of gout is pretty much in the normal range at 6.2.Total CK a measurement of muscle inflammation is normal at 22.  However muscle pain can exist even if CK is normal.Comprehensive metabolic panel is a group of labs that look at a variety of things.  The only abnormal findings for you are mildly elevated liver labs (ALT, total bilirubin, and alkaline phosphatase) which is not much changed from 4 months ago. TSH measurement of thyroid function is normal.Sedimentation rate a measure of generalized inflammation is normal at 11.  Other labs are still pending

## 2019-04-15 NOTE — Patient Instructions (Addendum)
Thank you for coming in today. Take the prednisone if stopping the atrovistatin does not help.  STOP atrovistatin.  Hold the atorvistatin for about 1 month. If pain is improved try restarting. If pain comes back we will know it is the medicine.  And we can use a different medicine for cholesterol.    Muscle Pain, Adult Muscle pain (myalgia) may be mild or severe. In most cases, the pain lasts only a short time and it goes away without treatment. It is normal to feel some muscle pain after starting a workout program. Muscles that have not been used often will be sore at first. Muscle pain may also be caused by many other things, including:  Overuse or muscle strain, especially if you are not in shape. This is the most common cause of muscle pain.  Injury.  Bruises.  Viruses, such as the flu.  Infectious diseases.  A chronic condition that causes muscle tenderness, fatigue, and headache (fibromyalgia).  A condition, such as lupus, in which the body's disease-fighting system attacks other organs in the body (autoimmune or rheumatologic diseases).  Certain drugs, including ACE inhibitors and statins. To diagnose the cause of your muscle pain, your health care provider will do a physical exam and ask questions about the pain and when it began. If you have not had muscle pain for very long, your health care provider may want to wait before doing much testing. If your muscle pain has lasted a long time, your health care provider may want to run tests right away. In some cases, this may include tests to rule out certain conditions or illnesses. Treatment for muscle pain depends on the cause. Home care is often enough to relieve muscle pain. Your health care provider may also prescribe anti-inflammatory medicine. Follow these instructions at home: Activity  If overuse is causing your muscle pain: ? Slow down your activities until the pain goes away. ? Do regular, gentle exercises if you are  not usually active. ? Warm up before exercising. Stretch before and after exercising. This can help lower the risk of muscle pain.  Do not continue working out if the pain is very bad. Bad pain could mean that you have injured a muscle. Managing pain and discomfort   If directed, apply ice to the sore muscle: ? Put ice in a plastic bag. ? Place a towel between your skin and the bag. ? Leave the ice on for 20 minutes, 2-3 times a day.  You may also alternate between applying ice and applying heat as told by your health care provider. To apply heat, use the heat source that your health care provider recommends, such as a moist heat pack or a heating pad. ? Place a towel between your skin and the heat source. ? Leave the heat on for 20-30 minutes. ? Remove the heat if your skin turns bright red. This is especially important if you are unable to feel pain, heat, or cold. You may have a greater risk of getting burned. Medicines  Take over-the-counter and prescription medicines only as told by your health care provider.  Do not drive or use heavy machinery while taking prescription pain medicine. Contact a health care provider if:  Your muscle pain gets worse and medicines do not help.  You have muscle pain that lasts longer than 3 days.  You have a rash or fever along with muscle pain.  You have muscle pain after a tick bite.  You have muscle pain while  working out, even though you are in good physical condition.  You have redness, soreness, or swelling along with muscle pain.  You have muscle pain after starting a new medicine or changing the dose of a medicine. Get help right away if:  You have trouble breathing.  You have trouble swallowing.  You have muscle pain along with a stiff neck, fever, and vomiting.  You have severe muscle weakness or cannot move part of your body. This information is not intended to replace advice given to you by your health care provider. Make  sure you discuss any questions you have with your health care provider. Document Revised: 01/12/2017 Document Reviewed: 06/22/2015 Elsevier Patient Education  [place].

## 2019-04-15 NOTE — Progress Notes (Signed)
I, Wendy Poet, LAT, ATC, am serving as scribe for Dr. Lynne Leader.  Jay Chapman is a 69 y.o. male who presents to [place] at [facility] today for f/u of B shoulder pain that is worse w/ attempts at overhead ROM and functional IR.  Pt was last seen by Dr. Georgina Snell on 03/18/19 and was provided a HEP consisting of RC and periscapular strengthening and referred to outpatient PT.  He also had a R subacromial injection on 03/18/19.  Since his last visit, pt reports his pain has returned.  He contacted me on February 22 noticing that his pain moved for 2 days after the shoulder injection.  He requested a course of oral prednisone.  5 days of 50 mg of prednisone resulted in immediate symptom relief which lasted for about a total of 7 or 8 days.  He notes he has diffuse muscle pain in his shoulders forearms upper legs and lower legs.  He thinks he probably has some sort of overall inflammatory condition.  He notes that he has been taking atorvastatin for some time now.  Diagnostic imaging: B shoulder XR- 03/04/19   Pertinent review of systems: No fevers or chills  Relevant historical information: Takes atorvastatin.  History hypothyroidism on levothyroxine.  No history rheumatologic problems.   Exam:  Wt 234 lb (106.1 kg)   BMI 30.87 kg/m  General: Well Developed, well nourished, and in no acute distress.   MSK: Normal motion.  Normal gait.    Lab and Radiology Results Recent Results (from the past 2160 hour(s))  C-reactive protein     Status: None   Collection Time: 03/04/19 11:03 AM  Result Value Ref Range   CRP <1.0 0.5 - 20.0 mg/dL  CBC with Differential/Platelet     Status: None   Collection Time: 03/04/19 11:03 AM  Result Value Ref Range   WBC 6.5 4.0 - 10.5 K/uL   RBC 4.78 4.22 - 5.81 Mil/uL   Hemoglobin 14.5 13.0 - 17.0 g/dL   HCT 43.7 39.0 - 52.0 %   MCV 91.4 78.0 - 100.0 fl   MCHC 33.3 30.0 - 36.0 g/dL   RDW 13.9 11.5 - 15.5 %   Platelets 191.0 150.0 -  400.0 K/uL   Neutrophils Relative % 65.6 43.0 - 77.0 %   Lymphocytes Relative 21.8 12.0 - 46.0 %   Monocytes Relative 7.5 3.0 - 12.0 %   Eosinophils Relative 4.0 0.0 - 5.0 %   Basophils Relative 1.1 0.0 - 3.0 %   Neutro Abs 4.3 1.4 - 7.7 K/uL   Lymphs Abs 1.4 0.7 - 4.0 K/uL   Monocytes Absolute 0.5 0.1 - 1.0 K/uL   Eosinophils Absolute 0.3 0.0 - 0.7 K/uL   Basophils Absolute 0.1 0.0 - 0.1 K/uL  Basic metabolic panel     Status: None   Collection Time: 03/04/19 11:03 AM  Result Value Ref Range   Sodium 139 135 - 145 mEq/L   Potassium 4.8 3.5 - 5.1 mEq/L   Chloride 104 96 - 112 mEq/L   CO2 27 19 - 32 mEq/L   Glucose, Bld 87 70 - 99 mg/dL   BUN 16 6 - 23 mg/dL   Creatinine, Ser 1.14 0.40 - 1.50 mg/dL   GFR 63.36 >60.00 mL/min   Calcium 9.8 8.4 - 10.5 mg/dL  Sedimentation rate     Status: None   Collection Time: 03/04/19 11:03 AM  Result Value Ref Range   Sed Rate 5 0 - 20 mm/hr  Assessment and Plan: 69 y.o. male with diffuse myalgias and arthralgias.  Patient had dramatic symptom improvement with short course of oral steroid.  Following the 5-day course of prednisone his symptoms return in a few days.  Concerned he has an overall systemic inflammatory conditions.  Based on review of his medical history medications I think statin myalgia is the most likely explanation.  Hypothyroidism myositis could also be a possibility.  Plan for rheumatologic work-up listed below.  We will repeat some labs already done in January and expand the work-up.  We will also check TSH and uric acid as well.  Recommend also patient stop atorvastatin for about a month.  If his pain improves off of atorvastatin he should try restarting it and if his pain returns we can be more certain that atorvastatin is the cause of his pain.  Additionally I have prescribed a longer course of prednisone.  He should start taking that in a few days if his pain does not improve off of atorvastatin.  Recheck back as needed.   Keep me updated.   PDMP not reviewed this encounter. Orders Placed This Encounter  Procedures  . Comprehensive metabolic panel    Standing Status:   Future    Standing Expiration Date:   04/14/2020  . Sedimentation rate    Standing Status:   Future    Standing Expiration Date:   04/14/2020  . Rheumatoid factor    Standing Status:   Future    Standing Expiration Date:   04/14/2020  . Cyclic citrul peptide antibody, IgG    Standing Status:   Future    Standing Expiration Date:   04/14/2020  . ANA    Standing Status:   Future    Standing Expiration Date:   06/15/2019  . CK    Standing Status:   Future    Standing Expiration Date:   04/14/2020  . Uric acid    Standing Status:   Future    Standing Expiration Date:   04/14/2020  . TSH    Standing Status:   Future    Standing Expiration Date:   07/16/2019   Meds ordered this encounter  Medications  . predniSONE (STERAPRED UNI-PAK 48 TAB) 10 MG (48) TBPK tablet    Sig: Take by mouth daily. 12 day dosepack po    Dispense:  48 tablet    Refill:  0     Discussed warning signs or symptoms. Please see discharge instructions. Patient expresses understanding.   The above documentation has been reviewed and is accurate and complete Lynne Leader   Total encounter time 30 minutes including charting time date of service.

## 2019-04-16 LAB — CYCLIC CITRUL PEPTIDE ANTIBODY, IGG: Cyclic Citrullin Peptide Ab: <16 UNITS

## 2019-04-16 LAB — RHEUMATOID FACTOR: Rheumatoid fact SerPl-aCnc: <14 IU/mL (ref <14)

## 2019-04-16 LAB — ANA: Anti Nuclear Antibody (ANA): NEGATIVE

## 2019-04-16 NOTE — Progress Notes (Signed)
2 labs for rheumatoid arthritis have come back negative so far.  One lab for lupus is still pending.

## 2019-04-17 NOTE — Progress Notes (Signed)
Last rheumatologic labs came back negative.  Labs were effectively pretty normal

## 2019-04-21 DIAGNOSIS — H524 Presbyopia: Secondary | ICD-10-CM | POA: Diagnosis not present

## 2019-04-21 DIAGNOSIS — H2513 Age-related nuclear cataract, bilateral: Secondary | ICD-10-CM | POA: Diagnosis not present

## 2019-04-21 DIAGNOSIS — H43813 Vitreous degeneration, bilateral: Secondary | ICD-10-CM | POA: Diagnosis not present

## 2019-04-21 DIAGNOSIS — H40013 Open angle with borderline findings, low risk, bilateral: Secondary | ICD-10-CM | POA: Diagnosis not present

## 2019-05-10 DIAGNOSIS — L259 Unspecified contact dermatitis, unspecified cause: Secondary | ICD-10-CM | POA: Diagnosis not present

## 2019-06-09 ENCOUNTER — Encounter: Payer: Self-pay | Admitting: Family Medicine

## 2019-06-25 ENCOUNTER — Encounter: Payer: Self-pay | Admitting: Family Medicine

## 2019-06-30 ENCOUNTER — Ambulatory Visit: Payer: Medicare HMO | Admitting: Family Medicine

## 2019-06-30 ENCOUNTER — Ambulatory Visit (INDEPENDENT_AMBULATORY_CARE_PROVIDER_SITE_OTHER)
Admission: RE | Admit: 2019-06-30 | Discharge: 2019-06-30 | Disposition: A | Discharge status: Home / Self Care | Payer: Medicare HMO | Source: Ambulatory Visit | Attending: Family Medicine | Admitting: Family Medicine

## 2019-06-30 ENCOUNTER — Encounter: Payer: Self-pay | Admitting: Family Medicine

## 2019-06-30 ENCOUNTER — Other Ambulatory Visit: Payer: Self-pay

## 2019-06-30 VITALS — BP 150/90 | HR 82 | Ht 73.0 in | Wt 235.0 lb

## 2019-06-30 DIAGNOSIS — E039 Hypothyroidism, unspecified: Secondary | ICD-10-CM | POA: Diagnosis not present

## 2019-06-30 DIAGNOSIS — M2550 Pain in unspecified joint: Secondary | ICD-10-CM | POA: Diagnosis not present

## 2019-06-30 DIAGNOSIS — M791 Myalgia, unspecified site: Secondary | ICD-10-CM

## 2019-06-30 DIAGNOSIS — M25512 Pain in left shoulder: Secondary | ICD-10-CM

## 2019-06-30 DIAGNOSIS — M25511 Pain in right shoulder: Secondary | ICD-10-CM | POA: Diagnosis not present

## 2019-06-30 DIAGNOSIS — M5136 Other intervertebral disc degeneration, lumbar region: Secondary | ICD-10-CM | POA: Diagnosis not present

## 2019-06-30 DIAGNOSIS — M130 Polyarthritis, unspecified: Secondary | ICD-10-CM | POA: Diagnosis not present

## 2019-06-30 LAB — CBC WITH DIFFERENTIAL/PLATELET
Basophils Absolute: 0.1 10*3/uL (ref 0.0–0.1)
Basophils Relative: 1.3 % (ref 0.0–3.0)
Eosinophils Absolute: 0.3 10*3/uL (ref 0.0–0.7)
Eosinophils Relative: 4 % (ref 0.0–5.0)
HCT: 41.2 % (ref 39.0–52.0)
Hemoglobin: 14 g/dL (ref 13.0–17.0)
Lymphocytes Relative: 21.6 % (ref 12.0–46.0)
Lymphs Abs: 1.4 10*3/uL (ref 0.7–4.0)
MCHC: 34 g/dL (ref 30.0–36.0)
MCV: 91.9 fl (ref 78.0–100.0)
Monocytes Absolute: 0.4 10*3/uL (ref 0.1–1.0)
Monocytes Relative: 7 % (ref 3.0–12.0)
Neutro Abs: 4.2 10*3/uL (ref 1.4–7.7)
Neutrophils Relative %: 66.1 % (ref 43.0–77.0)
Platelets: 188 10*3/uL (ref 150.0–400.0)
RBC: 4.48 Mil/uL (ref 4.22–5.81)
RDW: 15 % (ref 11.5–15.5)
WBC: 6.4 10*3/uL (ref 4.0–10.5)

## 2019-06-30 LAB — C-REACTIVE PROTEIN: CRP: <1 mg/dL (ref 0.5–20.0)

## 2019-06-30 LAB — CK: Total CK: 31 U/L (ref 7–232)

## 2019-06-30 LAB — SEDIMENTATION RATE: Sed Rate: 8 mm/hr (ref 0–20)

## 2019-06-30 MED ORDER — CELECOXIB 100 MG PO CAPS
100.0000 mg | ORAL_CAPSULE | Freq: Two times a day (BID) | ORAL | 1 refills | Status: DC
Start: 2019-06-30 — End: 2021-12-09

## 2019-06-30 NOTE — Patient Instructions (Addendum)
Plan for repeat labs and back xray and likely lumbar MRI.  Will refer to Rheum once labs are back.  Try celebrex for pain.  Get labs today and xray today.   Something is wrong. We will work to figure this out.

## 2019-06-30 NOTE — Progress Notes (Signed)
Rito Ehrlich, am serving as a Education administrator for Dr. Lynne Leader.  Jay Chapman is a 69 y.o. male who presents to [place] at [facility] today for follow up of bilateral shoulder and legs. Patient was last seen by Dr. Georgina Snell on 04/15/2019 for bilateral shoulder pain and reported pain has returned.  He contacted me on February 22 noticing that his pain moved for 2 days after the shoulder injection.  He requested a course of oral prednisone.  5 days of 50 mg of prednisone resulted in immediate symptom relief which lasted for about a total of 7 or 8 days.  He notes he has diffuse muscle pain in his shoulders forearms upper legs and lower legs.  He thinks he probably has some sort of overall inflammatory condition.  He notes that he has been taking atorvastatin for some time now. Since last visit patient has reported continued pain in B lateral shoulders and legs. Patient is wanting a referral to rheumatologist and discuss some sort of long term pain medication that offers the same relief as the prednisone.  Last prednisone was in March.   Pertinent review of systems: No fevers or chills  Relevant historical information: Patient has been off of atorvastatin since March with no significant benefit.   Exam:  BP (!) 150/90 (BP Location: Left Arm, Patient Position: Sitting, Cuff Size: Normal)   Pulse 82   Ht _0  (1.854 m)   Wt 235 lb (106.6 kg)   SpO2 98%   BMI 31.00 kg/m  General: Well Developed, well nourished, and in no acute distress.   MSK:  Right shoulder normal-appearing normal motion nontender. Normal strength. Negative Hawkins and Neer's test. Negative Yergason's and speeds test.  Left shoulder normal-appearing normal motion nontender. Normal strength. Negative Hawkins and Neer's test. Negative Yergason's and speeds test.  L-spine normal-appearing nontender normal lumbar motion. Lower extremity strength reflexes and sensation are intact distally. Mildly positive  slump test bilaterally. Resisted knee extension strength testing causes some pain in the thighs bilaterally.  Hips normal-appearing normal motion. Patient has pain in the slow to stand from a seated position.    Lab and Radiology Results  X-ray images L-spine obtained today personally and independently reviewed Significant DDD at L5-S1.  No acute fractures. Await formal radiology review  EXAM: LEFT SHOULDER - 2+ VIEW  COMPARISON:  None.  FINDINGS: There is no evidence of fracture or dislocation. There is no evidence of arthropathy or other focal bone abnormality. Soft tissues are unremarkable. Scarring within the left upper lobe and left lung base noted.  IMPRESSION: Negative.  EXAM: RIGHT SHOULDER - 2+ VIEW  COMPARISON:  None.  FINDINGS: Glenohumeral joint is intact. No evidence of scapular fracture or humeral fracture. The acromioclavicular joint is intact.  Remote internal fixation midshaft humerus fracture with bulky callus  IMPRESSION: No fracture or dislocation.   Electronically Signed   By: Suzy Bouchard M.D.   On: 03/04/2019 15:44  I, Lynne Leader, personally (independently) visualized and performed the interpretation of the images attached in this note.   Electronically Signed   By: Kerby Moors M.D.   On: 03/04/2019 15:43  Recent Results (from the past 2160 hour(s))  TSH     Status: None   Collection Time: 04/15/19 10:17 AM  Result Value Ref Range   TSH 4.44 0.35 - 4.50 uIU/mL  Uric acid     Status: None   Collection Time: 04/15/19 10:17 AM  Result Value Ref Range  Uric Acid, Serum 6.2 4.0 - 7.8 mg/dL  CK     Status: None   Collection Time: 04/15/19 10:17 AM  Result Value Ref Range   Total CK 22 7 - 242 U/L  Cyclic citrul peptide antibody, IgG     Status: None   Collection Time: 04/15/19 10:17 AM  Result Value Ref Range   Cyclic Citrullin Peptide Ab <16 UNITS    Comment: Reference Range Negative:            <20 Weak  Positive:       20-39 Moderate Positive:   40-59 Strong Positive:     >59 .   Rheumatoid factor     Status: None   Collection Time: 04/15/19 10:17 AM  Result Value Ref Range   Rhuematoid fact SerPl-aCnc <14 <14 IU/mL  Sedimentation rate     Status: None   Collection Time: 04/15/19 10:17 AM  Result Value Ref Range   Sed Rate 11 0 - 20 mm/hr  Comprehensive metabolic panel     Status: Abnormal   Collection Time: 04/15/19 10:17 AM  Result Value Ref Range   Sodium 136 135 - 145 mEq/L   Potassium 3.8 3.5 - 5.1 mEq/L   Chloride 101 96 - 112 mEq/L   CO2 28 19 - 32 mEq/L   Glucose, Bld 85 70 - 99 mg/dL   BUN 18 6 - 23 mg/dL   Creatinine, Ser 1.19 0.40 - 1.50 mg/dL   Total Bilirubin 1.3 (H) 0.2 - 1.2 mg/dL   Alkaline Phosphatase 153 (H) 39 - 117 U/L   AST 24 0 - 37 U/L   ALT 80 (H) 0 - 53 U/L   Total Protein 6.8 6.0 - 8.3 g/dL   Albumin 3.8 3.5 - 5.2 g/dL   GFR 60.27 >60.00 mL/min   Calcium 9.6 8.4 - 10.5 mg/dL  ANA     Status: None   Collection Time: 04/15/19  5:11 PM  Result Value Ref Range   Anti Nuclear Antibody (ANA) NEGATIVE NEGATIVE    Comment: ANA IFA is a first line screen for detecting the presence of up to approximately 150 autoantibodies in various autoimmune diseases. A negative ANA IFA result suggests an ANA-associated autoimmune disease is not present at this time, but is not definitive. If there is high clinical suspicion for Sjogren's syndrome, testing for anti-SS-A/Ro antibody should be considered. Anti-Jo-1 antibody should be considered for clinically suspected inflammatory myopathies. . AC-0: Negative . International Consensus on ANA Patterns (https://www.hernandez-brewer.com/) . For additional information, please refer to http://education.[facility].com/faq/FAQ177 (This link is being provided for informational/ educational purposes only.) .       Assessment and Plan: 69 y.o. male with bilateral leg pain and bilateral shoulder pain.   Certainly pain could be several separate issues including isolated orthopedic shoulder issue and possibly lumbar radiculopathy.  However his physical exam is not entirely consistent with classic shoulder problems or lumbar radiculopathy.  Additionally he has incredible improvement with oral prednisone.  This would be more consistent with a rheumatologic issue.  However he had some limited rheumatologic work-up back in March that was negative.  However this was done around the same time he had received steroids and could be a false negative.  Plan to work-up for possibility of lumbar radiculopathy with x-ray as above. Additionally will repeat rheumatologic work-up and extended a bit.  Likely will refer to rheumatology regardless even if labs are negative as his symptoms are more consistent with rheumatologic conditions. If rheum work-up  is negative would proceed with a lumbar MRI to evaluate for radiculopathy.    Orders Placed This Encounter  Procedures  . DG Lumbar Spine 2-3 Views    Standing Status:   Future    Standing Expiration Date:   08/29/2020    Order Specific Question:   Reason for Exam (SYMPTOM  OR DIAGNOSIS REQUIRED)    Answer:   eval possible radiulopathy    Order Specific Question:   Preferred imaging location?    Answer:   [facility]    Order Specific Question:   Radiology Contrast Protocol - do NOT remove file path    Answer:   [email or web address]  . C-reactive protein    Standing Status:   Future    Number of Occurrences:   1    Standing Expiration Date:   06/29/2020  . Sedimentation rate    Standing Status:   Future    Number of Occurrences:   1    Standing Expiration Date:   06/29/2020  . CBC with Differential/Platelet    Standing Status:   Future    Number of Occurrences:   1    Standing Expiration Date:   09/30/2019  . TSH    Standing Status:   Future    Number of Occurrences:   1    Standing Expiration Date:   09/30/2019    . CK    Standing Status:   Future    Number of Occurrences:   1    Standing Expiration Date:   06/29/2020  . ANA    Standing Status:   Future    Number of Occurrences:   1    Standing Expiration Date:   06/29/2020  . Cyclic citrul peptide antibody, IgG    Standing Status:   Future    Number of Occurrences:   1    Standing Expiration Date:   06/29/2020  . HLA-B27 antigen    Standing Status:   Future    Number of Occurrences:   1    Standing Expiration Date:   09/30/2019   Meds ordered this encounter  Medications  . celecoxib (CELEBREX) 100 MG capsule    Sig: Take 1 capsule (100 mg total) by mouth 2 (two) times daily.    Dispense:  60 capsule    Refill:  1     Discussed warning signs or symptoms. Please see discharge instructions. Patient expresses understanding.   The above documentation has been reviewed and is accurate and complete Lynne Leader, M.D.

## 2019-07-01 LAB — TSH: TSH: 3.27 u[IU]/mL (ref 0.35–4.50)

## 2019-07-01 NOTE — Progress Notes (Signed)
X-ray lumbar spine shows back arthritis

## 2019-07-01 NOTE — Progress Notes (Signed)
Labs so far are normal. Several labs are still pending however.

## 2019-07-03 ENCOUNTER — Encounter: Payer: Self-pay | Admitting: Family Medicine

## 2019-07-03 LAB — ANA: Anti Nuclear Antibody (ANA): NEGATIVE

## 2019-07-03 LAB — EXTRA SPECIMEN

## 2019-07-03 LAB — HLA-B27 ANTIGEN: HLA-B27 Antigen: NEGATIVE

## 2019-07-03 LAB — CYCLIC CITRUL PEPTIDE ANTIBODY, IGG: Cyclic Citrullin Peptide Ab: <16 UNITS

## 2019-07-03 MED ORDER — PREDNISONE 10 MG (48) PO TBPK
ORAL_TABLET | Freq: Every day | ORAL | 0 refills | Status: DC
Start: 2019-07-03 — End: 2019-10-01

## 2019-07-03 NOTE — Progress Notes (Signed)
Updated labs are also negative.  I still think it is worthwhile to least talk with a rheumatologist. If pain is not better controlled with higher doses of Celebrex let me know and I will send in some prednisone.

## 2019-07-03 NOTE — Addendum Note (Signed)
Addended by: Gregor Hams on: 07/03/2019 04:12 PM   Modules accepted: Orders

## 2019-07-03 NOTE — Addendum Note (Signed)
Addended by: Gregor Hams on: 07/03/2019 03:57 PM   Modules accepted: Orders

## 2019-10-01 ENCOUNTER — Other Ambulatory Visit: Payer: Self-pay | Admitting: Family Medicine

## 2019-10-01 MED ORDER — PREDNISONE 10 MG (48) PO TBPK: ORAL_TABLET | Freq: Every day | ORAL | 0 refills | Status: DC

## 2019-10-01 NOTE — Telephone Encounter (Signed)
Please advise 

## 2019-11-05 ENCOUNTER — Telehealth: Payer: Self-pay | Admitting: Internal Medicine

## 2019-11-05 DIAGNOSIS — M25511 Pain in right shoulder: Secondary | ICD-10-CM

## 2019-11-05 DIAGNOSIS — Z0000 Encounter for general adult medical examination without abnormal findings: Secondary | ICD-10-CM

## 2019-11-05 DIAGNOSIS — R739 Hyperglycemia, unspecified: Secondary | ICD-10-CM

## 2019-11-05 DIAGNOSIS — E039 Hypothyroidism, unspecified: Secondary | ICD-10-CM

## 2019-11-05 NOTE — Telephone Encounter (Signed)
Sent to Dr. John. 

## 2019-11-05 NOTE — Telephone Encounter (Signed)
Patient was wondering if there was any way that he could get lab work done before his appointment on 11/24/2019.

## 2019-11-06 NOTE — Telephone Encounter (Signed)
Ok labs are ordered 

## 2019-11-17 ENCOUNTER — Encounter: Payer: Self-pay | Admitting: Internal Medicine

## 2019-11-20 ENCOUNTER — Other Ambulatory Visit (INDEPENDENT_AMBULATORY_CARE_PROVIDER_SITE_OTHER): Payer: Medicare HMO

## 2019-11-20 DIAGNOSIS — Z0000 Encounter for general adult medical examination without abnormal findings: Secondary | ICD-10-CM | POA: Diagnosis not present

## 2019-11-20 DIAGNOSIS — M25512 Pain in left shoulder: Secondary | ICD-10-CM

## 2019-11-20 DIAGNOSIS — E039 Hypothyroidism, unspecified: Secondary | ICD-10-CM

## 2019-11-20 DIAGNOSIS — M25511 Pain in right shoulder: Secondary | ICD-10-CM

## 2019-11-20 DIAGNOSIS — R739 Hyperglycemia, unspecified: Secondary | ICD-10-CM

## 2019-11-20 DIAGNOSIS — Z125 Encounter for screening for malignant neoplasm of prostate: Secondary | ICD-10-CM

## 2019-11-20 LAB — COMPREHENSIVE METABOLIC PANEL
ALT: 25 U/L (ref 0–53)
AST: 16 U/L (ref 0–37)
Albumin: 4.1 g/dL (ref 3.5–5.2)
Alkaline Phosphatase: 87 U/L (ref 39–117)
BUN: 17 mg/dL (ref 6–23)
CO2: 26 mEq/L (ref 19–32)
Calcium: 9.1 mg/dL (ref 8.4–10.5)
Chloride: 105 mEq/L (ref 96–112)
Creatinine, Ser: 1.23 mg/dL (ref 0.40–1.50)
GFR: 58.48 mL/min — ABNORMAL LOW (ref >60.00)
Glucose, Bld: 84 mg/dL (ref 70–99)
Potassium: 4 mEq/L (ref 3.5–5.1)
Sodium: 138 mEq/L (ref 135–145)
Total Bilirubin: 1.1 mg/dL (ref 0.2–1.2)
Total Protein: 6.3 g/dL (ref 6.0–8.3)

## 2019-11-20 LAB — URINALYSIS, ROUTINE W REFLEX MICROSCOPIC
Bilirubin Urine: NEGATIVE
Hgb urine dipstick: NEGATIVE
Ketones, ur: NEGATIVE
Leukocytes,Ua: NEGATIVE
Nitrite: NEGATIVE
Specific Gravity, Urine: <=1.005 — AB (ref 1.000–1.030)
Total Protein, Urine: NEGATIVE
Urine Glucose: NEGATIVE
Urobilinogen, UA: 0.2 (ref 0.0–1.0)
pH: 6 (ref 5.0–8.0)

## 2019-11-20 LAB — LIPID PANEL
Cholesterol: 228 mg/dL — ABNORMAL HIGH (ref 0–200)
HDL: 28.5 mg/dL — ABNORMAL LOW (ref >39.00)
NonHDL: 199.73
Total CHOL/HDL Ratio: 8
Triglycerides: 257 mg/dL — ABNORMAL HIGH (ref 0.0–149.0)
VLDL: 51.4 mg/dL — ABNORMAL HIGH (ref 0.0–40.0)

## 2019-11-20 LAB — CBC WITH DIFFERENTIAL/PLATELET
Basophils Absolute: 0.1 10*3/uL (ref 0.0–0.1)
Basophils Relative: 1 % (ref 0.0–3.0)
Eosinophils Absolute: 0.2 10*3/uL (ref 0.0–0.7)
Eosinophils Relative: 3.5 % (ref 0.0–5.0)
HCT: 42.7 % (ref 39.0–52.0)
Hemoglobin: 14.3 g/dL (ref 13.0–17.0)
Lymphocytes Relative: 23.6 % (ref 12.0–46.0)
Lymphs Abs: 1.4 10*3/uL (ref 0.7–4.0)
MCHC: 33.5 g/dL (ref 30.0–36.0)
MCV: 92 fl (ref 78.0–100.0)
Monocytes Absolute: 0.5 10*3/uL (ref 0.1–1.0)
Monocytes Relative: 7.7 % (ref 3.0–12.0)
Neutro Abs: 3.8 10*3/uL (ref 1.4–7.7)
Neutrophils Relative %: 64.2 % (ref 43.0–77.0)
Platelets: 157 10*3/uL (ref 150.0–400.0)
RBC: 4.64 Mil/uL (ref 4.22–5.81)
RDW: 14.3 % (ref 11.5–15.5)
WBC: 5.9 10*3/uL (ref 4.0–10.5)

## 2019-11-20 LAB — PSA: PSA: 0.67 ng/mL (ref 0.10–4.00)

## 2019-11-20 LAB — HEMOGLOBIN A1C: Hgb A1c MFr Bld: 5.1 % (ref 4.6–6.5)

## 2019-11-20 LAB — T4, FREE: Free T4: 1.23 ng/dL (ref 0.60–1.60)

## 2019-11-20 LAB — LDL CHOLESTEROL, DIRECT: Direct LDL: 159 mg/dL

## 2019-11-20 LAB — TSH: TSH: 3.64 u[IU]/mL (ref 0.35–4.50)

## 2019-11-20 LAB — SEDIMENTATION RATE: Sed Rate: 26 mm/hr — ABNORMAL HIGH (ref 0–20)

## 2019-11-20 NOTE — Progress Notes (Unsigned)
mirco

## 2019-11-20 NOTE — Addendum Note (Signed)
Addended by: Trenda Moots on: 98/03/4297 07:40 AM   Modules accepted: Orders

## 2019-11-24 ENCOUNTER — Other Ambulatory Visit: Payer: Self-pay

## 2019-11-24 ENCOUNTER — Ambulatory Visit: Payer: Medicare HMO | Admitting: Internal Medicine

## 2019-11-24 ENCOUNTER — Ambulatory Visit (INDEPENDENT_AMBULATORY_CARE_PROVIDER_SITE_OTHER): Payer: Medicare HMO | Admitting: Internal Medicine

## 2019-11-24 ENCOUNTER — Encounter: Payer: Self-pay | Admitting: Internal Medicine

## 2019-11-24 VITALS — BP 140/86 | HR 72 | Temp 98.3°F | Ht 73.0 in | Wt 235.0 lb

## 2019-11-24 DIAGNOSIS — Z0000 Encounter for general adult medical examination without abnormal findings: Secondary | ICD-10-CM

## 2019-11-24 DIAGNOSIS — E559 Vitamin D deficiency, unspecified: Secondary | ICD-10-CM

## 2019-11-24 DIAGNOSIS — M1990 Unspecified osteoarthritis, unspecified site: Secondary | ICD-10-CM

## 2019-11-24 DIAGNOSIS — E039 Hypothyroidism, unspecified: Secondary | ICD-10-CM | POA: Diagnosis not present

## 2019-11-24 DIAGNOSIS — I10 Essential (primary) hypertension: Secondary | ICD-10-CM

## 2019-11-24 DIAGNOSIS — N289 Disorder of kidney and ureter, unspecified: Secondary | ICD-10-CM | POA: Diagnosis not present

## 2019-11-24 DIAGNOSIS — Z0001 Encounter for general adult medical examination with abnormal findings: Secondary | ICD-10-CM

## 2019-11-24 DIAGNOSIS — E785 Hyperlipidemia, unspecified: Secondary | ICD-10-CM | POA: Diagnosis not present

## 2019-11-24 DIAGNOSIS — Z23 Encounter for immunization: Secondary | ICD-10-CM

## 2019-11-24 MED ORDER — ASPIRIN 81 MG PO TBEC: 81.0000 mg | DELAYED_RELEASE_TABLET | Freq: Every day | ORAL | 12 refills | Status: DC

## 2019-11-24 MED ORDER — ATORVASTATIN CALCIUM 40 MG PO TABS: 40.0000 mg | ORAL_TABLET | Freq: Every day | ORAL | 3 refills | Status: DC
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
## 2019-11-24 NOTE — Patient Instructions (Signed)
Please make sure to follow through with your rheumatology referral  Ok to restart the lipitor 40 mg per day, and the aspirin 81 mg per day  Please continue all other medications as before, and refills have been done if requested.  Please have the pharmacy call with any other refills you may need.  Please continue your efforts at being more active, low cholesterol diet, and weight control.  You are otherwise up to date with prevention measures today.  Please keep your appointments with your specialists as you may have planned  Please make an Appointment to return in 6 months, or sooner if needed, with labs done at the [facility] lab prior

## 2019-11-27 ENCOUNTER — Other Ambulatory Visit: Payer: Self-pay | Admitting: Internal Medicine

## 2019-11-27 NOTE — Telephone Encounter (Signed)
Please refill as per office routine med refill policy (all routine meds refilled for 3 mo or monthly per pt preference up to one year from last visit, then month to month grace period for 3 mo, then further med refills will have to be denied)  

## 2019-12-01 ENCOUNTER — Encounter: Payer: Self-pay | Admitting: Internal Medicine

## 2019-12-01 MED ORDER — ATORVASTATIN CALCIUM 40 MG PO TABS
40.0000 mg | ORAL_TABLET | Freq: Every day | ORAL | 3 refills | Status: DC
Start: 2019-12-01 — End: 2020-11-24

## 2019-12-01 MED ORDER — BENAZEPRIL HCL 40 MG PO TABS: 40.0000 mg | ORAL_TABLET | Freq: Every day | ORAL | 3 refills | Status: DC

## 2019-12-01 MED ORDER — LEVOTHYROXINE SODIUM 112 MCG PO TABS
112.0000 ug | ORAL_TABLET | Freq: Every day | ORAL | 3 refills | Status: DC
Start: 2019-12-01 — End: 2020-11-24

## 2019-12-01 MED ORDER — AMLODIPINE BESYLATE 5 MG PO TABS: 5.0000 mg | ORAL_TABLET | Freq: Every day | ORAL | 3 refills | Status: DC

## 2019-12-01 NOTE — Addendum Note (Signed)
Addended by: Earnstine Regal on: 12/01/2019 04:17 PM   Modules accepted: Orders

## 2019-12-07 ENCOUNTER — Encounter: Payer: Self-pay | Admitting: Internal Medicine

## 2019-12-07 DIAGNOSIS — N179 Acute kidney failure, unspecified: Secondary | ICD-10-CM | POA: Insufficient documentation

## 2019-12-07 DIAGNOSIS — N289 Disorder of kidney and ureter, unspecified: Secondary | ICD-10-CM | POA: Insufficient documentation

## 2019-12-07 DIAGNOSIS — M1990 Unspecified osteoarthritis, unspecified site: Secondary | ICD-10-CM

## 2019-12-07 DIAGNOSIS — N183 Chronic kidney disease, stage 3 unspecified: Secondary | ICD-10-CM | POA: Insufficient documentation

## 2019-12-07 DIAGNOSIS — E559 Vitamin D deficiency, unspecified: Secondary | ICD-10-CM | POA: Insufficient documentation

## 2019-12-07 HISTORY — DX: Unspecified osteoarthritis, unspecified site: M19.90

## 2019-12-07 NOTE — Assessment & Plan Note (Signed)
For oral replacement 

## 2019-12-07 NOTE — Assessment & Plan Note (Signed)
stable overall by history and exam, recent data reviewed with pt, and pt to continue medical treatment as before,  to f/u any worsening symptoms or concerns  

## 2019-12-07 NOTE — Assessment & Plan Note (Signed)
To restart lipitor 40 and asa 81,  to f/u any worsening symptoms or concerns

## 2019-12-07 NOTE — Assessment & Plan Note (Addendum)
D/w pt, to continue prednisone asd, decline rheumatology referral, d/c celebrex as is not helping  I spent 21 minutes in addition to time for CPX wellness examination in preparing to see the patient by review of recent labs, imaging and procedures, obtaining and reviewing separately obtained history, communicating with the patient and family or caregiver, ordering medications, tests or procedures, and documenting clinical information in the EHR including the differential Dx, treatment, and any further evaluation and other management of inflammatory arthritis, hld, hypothyroidism, htn,

## 2019-12-07 NOTE — Assessment & Plan Note (Signed)

## 2019-12-07 NOTE — Assessment & Plan Note (Signed)
?   CKD, for f/u lab next visit

## 2020-02-24 ENCOUNTER — Encounter: Payer: Self-pay | Admitting: Family Medicine

## 2020-05-06 ENCOUNTER — Other Ambulatory Visit: Payer: Self-pay | Admitting: Internal Medicine

## 2020-05-06 ENCOUNTER — Encounter: Payer: Self-pay | Admitting: Internal Medicine

## 2020-05-06 MED ORDER — METRONIDAZOLE 0.75 % EX CREA: TOPICAL_CREAM | Freq: Two times a day (BID) | CUTANEOUS | 0 refills | Status: DC

## 2020-06-16 DIAGNOSIS — C4441 Basal cell carcinoma of skin of scalp and neck: Secondary | ICD-10-CM | POA: Diagnosis not present

## 2020-06-16 DIAGNOSIS — I781 Nevus, non-neoplastic: Secondary | ICD-10-CM | POA: Diagnosis not present

## 2020-06-16 DIAGNOSIS — L718 Other rosacea: Secondary | ICD-10-CM | POA: Diagnosis not present

## 2020-06-16 DIAGNOSIS — C44319 Basal cell carcinoma of skin of other parts of face: Secondary | ICD-10-CM | POA: Diagnosis not present

## 2020-07-20 DIAGNOSIS — Z85828 Personal history of other malignant neoplasm of skin: Secondary | ICD-10-CM | POA: Diagnosis not present

## 2020-07-20 DIAGNOSIS — Z08 Encounter for follow-up examination after completed treatment for malignant neoplasm: Secondary | ICD-10-CM | POA: Diagnosis not present

## 2020-07-20 DIAGNOSIS — B078 Other viral warts: Secondary | ICD-10-CM | POA: Diagnosis not present

## 2020-09-07 ENCOUNTER — Encounter: Payer: Self-pay | Admitting: Internal Medicine

## 2020-09-07 MED ORDER — EPINEPHRINE 0.3 MG/0.3ML IJ SOAJ: 0.3000 mg | INTRAMUSCULAR | 2 refills | Status: DC | PRN

## 2020-11-02 DIAGNOSIS — R69 Illness, unspecified: Secondary | ICD-10-CM | POA: Diagnosis not present

## 2020-11-18 ENCOUNTER — Other Ambulatory Visit (INDEPENDENT_AMBULATORY_CARE_PROVIDER_SITE_OTHER): Payer: Medicare HMO

## 2020-11-18 DIAGNOSIS — E785 Hyperlipidemia, unspecified: Secondary | ICD-10-CM

## 2020-11-18 DIAGNOSIS — E559 Vitamin D deficiency, unspecified: Secondary | ICD-10-CM

## 2020-11-18 DIAGNOSIS — M1990 Unspecified osteoarthritis, unspecified site: Secondary | ICD-10-CM

## 2020-11-18 DIAGNOSIS — N289 Disorder of kidney and ureter, unspecified: Secondary | ICD-10-CM

## 2020-11-18 LAB — HEPATIC FUNCTION PANEL
ALT: 21 U/L (ref 0–53)
AST: 18 U/L (ref 0–37)
Albumin: 4.3 g/dL (ref 3.5–5.2)
Alkaline Phosphatase: 105 U/L (ref 39–117)
Bilirubin, Direct: 0.2 mg/dL (ref 0.0–0.3)
Total Bilirubin: 1.1 mg/dL (ref 0.2–1.2)
Total Protein: 6.9 g/dL (ref 6.0–8.3)

## 2020-11-18 LAB — BASIC METABOLIC PANEL
BUN: 15 mg/dL (ref 6–23)
CO2: 26 mEq/L (ref 19–32)
Calcium: 9.5 mg/dL (ref 8.4–10.5)
Chloride: 104 mEq/L (ref 96–112)
Creatinine, Ser: 1.31 mg/dL (ref 0.40–1.50)
GFR: 54.37 mL/min — ABNORMAL LOW (ref >60.00)
Glucose, Bld: 81 mg/dL (ref 70–99)
Potassium: 3.8 mEq/L (ref 3.5–5.1)
Sodium: 139 mEq/L (ref 135–145)

## 2020-11-18 LAB — LIPID PANEL
Cholesterol: 200 mg/dL (ref 0–200)
HDL: 31.1 mg/dL — ABNORMAL LOW (ref >39.00)
LDL Cholesterol: 133 mg/dL — ABNORMAL HIGH (ref 0–99)
NonHDL: 168.74
Total CHOL/HDL Ratio: 6
Triglycerides: 179 mg/dL — ABNORMAL HIGH (ref 0.0–149.0)
VLDL: 35.8 mg/dL (ref 0.0–40.0)

## 2020-11-18 LAB — SEDIMENTATION RATE: Sed Rate: 25 mm/hr — ABNORMAL HIGH (ref 0–20)

## 2020-11-18 LAB — VITAMIN D 25 HYDROXY (VIT D DEFICIENCY, FRACTURES): VITD: 21.51 ng/mL — ABNORMAL LOW (ref 30.00–100.00)

## 2020-11-18 LAB — C-REACTIVE PROTEIN: CRP: <1 mg/dL (ref 0.5–20.0)

## 2020-11-18 NOTE — Addendum Note (Signed)
Addended by: Azzie Almas on: 11/18/2020 08:59 AM   Modules accepted: Orders

## 2020-11-20 LAB — PTH, INTACT AND CALCIUM
Calcium: 9.5 mg/dL (ref 8.6–10.3)
PTH: 62 pg/mL (ref 16–77)

## 2020-11-24 ENCOUNTER — Other Ambulatory Visit: Payer: Self-pay

## 2020-11-24 ENCOUNTER — Ambulatory Visit (INDEPENDENT_AMBULATORY_CARE_PROVIDER_SITE_OTHER): Payer: Medicare HMO | Admitting: Internal Medicine

## 2020-11-24 ENCOUNTER — Other Ambulatory Visit: Payer: Self-pay | Admitting: Internal Medicine

## 2020-11-24 ENCOUNTER — Encounter: Payer: Self-pay | Admitting: Internal Medicine

## 2020-11-24 VITALS — BP 126/70 | HR 75 | Ht 73.0 in | Wt 239.0 lb

## 2020-11-24 DIAGNOSIS — Z23 Encounter for immunization: Secondary | ICD-10-CM | POA: Diagnosis not present

## 2020-11-24 DIAGNOSIS — E782 Mixed hyperlipidemia: Secondary | ICD-10-CM | POA: Diagnosis not present

## 2020-11-24 DIAGNOSIS — N1831 Chronic kidney disease, stage 3a: Secondary | ICD-10-CM | POA: Diagnosis not present

## 2020-11-24 DIAGNOSIS — E039 Hypothyroidism, unspecified: Secondary | ICD-10-CM

## 2020-11-24 DIAGNOSIS — E559 Vitamin D deficiency, unspecified: Secondary | ICD-10-CM | POA: Diagnosis not present

## 2020-11-24 DIAGNOSIS — I10 Essential (primary) hypertension: Secondary | ICD-10-CM | POA: Diagnosis not present

## 2020-11-24 DIAGNOSIS — R739 Hyperglycemia, unspecified: Secondary | ICD-10-CM

## 2020-11-24 DIAGNOSIS — Z0001 Encounter for general adult medical examination with abnormal findings: Secondary | ICD-10-CM

## 2020-11-24 DIAGNOSIS — M1990 Unspecified osteoarthritis, unspecified site: Secondary | ICD-10-CM | POA: Diagnosis not present

## 2020-11-24 MED ORDER — ATORVASTATIN CALCIUM 80 MG PO TABS: ORAL_TABLET | ORAL | 3 refills | Status: DC

## 2020-11-24 MED ORDER — CHOLECALCIFEROL 50 MCG (2000 UT) PO TABS: ORAL_TABLET | ORAL | 99 refills | Status: DC

## 2020-11-24 NOTE — Telephone Encounter (Signed)
Please refill as per office routine med refill policy (all routine meds to be refilled for 3 mo or monthly (per pt preference) up to one year from last visit, then month to month grace period for 3 mo, then further med refills will have to be denied) ? ?
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
## 2020-11-24 NOTE — Assessment & Plan Note (Signed)
Last vitamin D Lab Results  Component Value Date   VD25OH 21.51 (L) 11/18/2020   Low, to start oral replacement

## 2020-11-24 NOTE — Assessment & Plan Note (Signed)
Mild currently stable, etiology unclear, hold on further prednisone for now

## 2020-11-24 NOTE — Patient Instructions (Addendum)
We have discussed the Cardiac CT Score test to measure the calcification level (if any) in your heart arteries.  This test has been ordered in our [place], so please call [place] [facility] directly, as they prefer this, at [phone] to be scheduled.  Please take OTC Vitamin D3 at 2000 units per day, indefinitely  Ok to increase the lipitor to 80 mg every other day  Please continue all other medications as before, and refills have been done if requested.  Please have the pharmacy call with any other refills you may need.  Please continue your efforts at being more active, low cholesterol diet, and weight control.  You are otherwise up to date with prevention measures today.  Please keep your appointments with your specialists as you may have planned  Please go to the [facility] lab in 4 weeks for follow up lipids and other testing not done for your visit today  You will be contacted by phone if any changes need to be made immediately.  Otherwise, you will receive a letter about your results with an explanation, but please check with MyChart first.  Please remember to sign up for MyChart if you have not done so, as this will be important to you in the future with finding out test results, communicating by private email, and scheduling acute appointments online when needed.  Please make an Appointment to return for your 1 year visit, or sooner if needed, with Lab testing by Appointment as well, to be done about 3-5 days before at the [place] (so this is for TWO appointments - please see the scheduling desk as you leave)  Due to the ongoing Covid 19 pandemic, our lab now requires an appointment for any labs done at our office.  If you need labs done and do not have an appointment, please call our office ahead of time to schedule before presenting to the lab for your testing.

## 2020-11-27 DIAGNOSIS — R739 Hyperglycemia, unspecified: Secondary | ICD-10-CM | POA: Insufficient documentation

## 2020-11-27 NOTE — Assessment & Plan Note (Signed)
BP Readings from Last 3 Encounters:  11/24/20 126/70  11/24/19 140/86  06/30/19 (!) 150/90   Stable, pt to continue medical treatment norvasc, lotensin

## 2020-11-27 NOTE — Assessment & Plan Note (Signed)
Lab Results  Component Value Date   TSH 3.64 11/20/2019   Stable, pt to continue levothyroxine

## 2020-11-27 NOTE — Assessment & Plan Note (Signed)
Lab Results  Component Value Date   LDLCALC 133 (H) 11/18/2020   Uncontrolled, goal ldl < 100, pt to start lipitor 80, f/u lab at 4 wks

## 2020-11-27 NOTE — Assessment & Plan Note (Signed)
Lab Results  Component Value Date   CREATININE 1.31 11/18/2020   Stable overall, cont to avoid nephrotoxins

## 2020-11-27 NOTE — Assessment & Plan Note (Signed)
Age and sex appropriate education and counseling updated with regular exercise and diet Referrals for preventative services - declines cologuard Immunizations addressed - declines covid booster, shingrix Smoking counseling  - none needed Evidence for depression or other mood disorder - none significant Most recent labs reviewed. I have personally reviewed and have noted: 1) the patient's medical and social history 2) The patient's current medications and supplements 3) The patient's height, weight, and BMI have been recorded in the chart

## 2020-11-27 NOTE — Assessment & Plan Note (Signed)
Lab Results  Component Value Date   HGBA1C 5.1 11/20/2019   Stable, pt to continue current medical treatment  - diet

## 2021-01-26 ENCOUNTER — Other Ambulatory Visit: Payer: Self-pay | Admitting: Internal Medicine

## 2021-04-15 ENCOUNTER — Encounter: Payer: Self-pay | Admitting: Internal Medicine

## 2021-05-16 ENCOUNTER — Other Ambulatory Visit: Payer: Self-pay | Admitting: Internal Medicine

## 2021-05-16 DIAGNOSIS — Z0001 Encounter for general adult medical examination with abnormal findings: Secondary | ICD-10-CM

## 2021-05-16 DIAGNOSIS — E538 Deficiency of other specified B group vitamins: Secondary | ICD-10-CM

## 2021-05-16 DIAGNOSIS — R739 Hyperglycemia, unspecified: Secondary | ICD-10-CM

## 2021-05-16 DIAGNOSIS — E785 Hyperlipidemia, unspecified: Secondary | ICD-10-CM

## 2021-05-16 DIAGNOSIS — N1831 Chronic kidney disease, stage 3a: Secondary | ICD-10-CM

## 2021-05-16 DIAGNOSIS — E559 Vitamin D deficiency, unspecified: Secondary | ICD-10-CM

## 2021-06-20 DIAGNOSIS — Z1211 Encounter for screening for malignant neoplasm of colon: Secondary | ICD-10-CM | POA: Diagnosis not present

## 2021-07-03 ENCOUNTER — Other Ambulatory Visit: Payer: Self-pay | Admitting: Internal Medicine

## 2021-07-03 DIAGNOSIS — R195 Other fecal abnormalities: Secondary | ICD-10-CM

## 2021-07-03 LAB — COLOGUARD: COLOGUARD: POSITIVE — AB

## 2021-08-31 ENCOUNTER — Encounter: Payer: Self-pay | Admitting: Internal Medicine

## 2021-11-08 ENCOUNTER — Encounter: Payer: Medicare HMO | Admitting: Internal Medicine

## 2021-11-16 ENCOUNTER — Encounter: Payer: Self-pay | Admitting: Internal Medicine

## 2021-11-26 ENCOUNTER — Other Ambulatory Visit: Payer: Self-pay | Admitting: Internal Medicine

## 2021-11-26 NOTE — Telephone Encounter (Signed)
Please refill as per office routine med refill policy (all routine meds to be refilled for 3 mo or monthly (per pt preference) up to one year from last visit, then month to month grace period for 3 mo, then further med refills will have to be denied) ? ?

## 2021-12-01 ENCOUNTER — Other Ambulatory Visit (INDEPENDENT_AMBULATORY_CARE_PROVIDER_SITE_OTHER): Payer: Medicare HMO

## 2021-12-01 DIAGNOSIS — E538 Deficiency of other specified B group vitamins: Secondary | ICD-10-CM | POA: Diagnosis not present

## 2021-12-01 DIAGNOSIS — E559 Vitamin D deficiency, unspecified: Secondary | ICD-10-CM

## 2021-12-01 DIAGNOSIS — Z0001 Encounter for general adult medical examination with abnormal findings: Secondary | ICD-10-CM

## 2021-12-01 DIAGNOSIS — Z125 Encounter for screening for malignant neoplasm of prostate: Secondary | ICD-10-CM

## 2021-12-01 DIAGNOSIS — R739 Hyperglycemia, unspecified: Secondary | ICD-10-CM

## 2021-12-01 DIAGNOSIS — E785 Hyperlipidemia, unspecified: Secondary | ICD-10-CM | POA: Diagnosis not present

## 2021-12-01 LAB — URINALYSIS, ROUTINE W REFLEX MICROSCOPIC
Bilirubin Urine: NEGATIVE
Hgb urine dipstick: NEGATIVE
Ketones, ur: NEGATIVE
Leukocytes,Ua: NEGATIVE
Nitrite: NEGATIVE
RBC / HPF: NONE SEEN (ref 0–?)
Specific Gravity, Urine: 1.015 (ref 1.000–1.030)
Total Protein, Urine: NEGATIVE
Urine Glucose: NEGATIVE
Urobilinogen, UA: 1 (ref 0.0–1.0)
pH: 7 (ref 5.0–8.0)

## 2021-12-01 LAB — CBC WITH DIFFERENTIAL/PLATELET
Basophils Absolute: 0.1 10*3/uL (ref 0.0–0.1)
Basophils Relative: 1 % (ref 0.0–3.0)
Eosinophils Absolute: 0.2 10*3/uL (ref 0.0–0.7)
Eosinophils Relative: 3.8 % (ref 0.0–5.0)
HCT: 41.3 % (ref 39.0–52.0)
Hemoglobin: 13.8 g/dL (ref 13.0–17.0)
Lymphocytes Relative: 26.2 % (ref 12.0–46.0)
Lymphs Abs: 1.5 10*3/uL (ref 0.7–4.0)
MCHC: 33.4 g/dL (ref 30.0–36.0)
MCV: 90.3 fl (ref 78.0–100.0)
Monocytes Absolute: 0.5 10*3/uL (ref 0.1–1.0)
Monocytes Relative: 8.4 % (ref 3.0–12.0)
Neutro Abs: 3.4 10*3/uL (ref 1.4–7.7)
Neutrophils Relative %: 60.6 % (ref 43.0–77.0)
Platelets: 178 10*3/uL (ref 150.0–400.0)
RBC: 4.57 Mil/uL (ref 4.22–5.81)
RDW: 14.3 % (ref 11.5–15.5)
WBC: 5.6 10*3/uL (ref 4.0–10.5)

## 2021-12-01 LAB — LIPID PANEL
Cholesterol: 216 mg/dL — ABNORMAL HIGH (ref 0–200)
HDL: 26.7 mg/dL — ABNORMAL LOW (ref >39.00)
NonHDL: 189.44
Total CHOL/HDL Ratio: 8
Triglycerides: 234 mg/dL — ABNORMAL HIGH (ref 0.0–149.0)
VLDL: 46.8 mg/dL — ABNORMAL HIGH (ref 0.0–40.0)

## 2021-12-01 LAB — BASIC METABOLIC PANEL
BUN: 17 mg/dL (ref 6–23)
CO2: 27 mEq/L (ref 19–32)
Calcium: 9.4 mg/dL (ref 8.4–10.5)
Chloride: 103 mEq/L (ref 96–112)
Creatinine, Ser: 1.4 mg/dL (ref 0.40–1.50)
GFR: 49.84 mL/min — ABNORMAL LOW (ref >60.00)
Glucose, Bld: 86 mg/dL (ref 70–99)
Potassium: 4.8 mEq/L (ref 3.5–5.1)
Sodium: 137 mEq/L (ref 135–145)

## 2021-12-01 LAB — LDL CHOLESTEROL, DIRECT: Direct LDL: 160 mg/dL

## 2021-12-01 LAB — HEPATIC FUNCTION PANEL
ALT: 22 U/L (ref 0–53)
AST: 20 U/L (ref 0–37)
Albumin: 4.2 g/dL (ref 3.5–5.2)
Alkaline Phosphatase: 146 U/L — ABNORMAL HIGH (ref 39–117)
Bilirubin, Direct: 0.2 mg/dL (ref 0.0–0.3)
Total Bilirubin: 1 mg/dL (ref 0.2–1.2)
Total Protein: 6.6 g/dL (ref 6.0–8.3)

## 2021-12-01 LAB — HEMOGLOBIN A1C: Hgb A1c MFr Bld: 5.2 % (ref 4.6–6.5)

## 2021-12-01 LAB — PSA: PSA: 0.46 ng/mL (ref 0.10–4.00)

## 2021-12-01 LAB — VITAMIN D 25 HYDROXY (VIT D DEFICIENCY, FRACTURES): VITD: 20.01 ng/mL — ABNORMAL LOW (ref 30.00–100.00)

## 2021-12-01 LAB — TSH: TSH: 5.2 u[IU]/mL (ref 0.35–5.50)

## 2021-12-01 LAB — VITAMIN B12: Vitamin B-12: 208 pg/mL — ABNORMAL LOW (ref 211–911)

## 2021-12-09 ENCOUNTER — Ambulatory Visit (INDEPENDENT_AMBULATORY_CARE_PROVIDER_SITE_OTHER): Payer: Medicare HMO | Admitting: Internal Medicine

## 2021-12-09 VITALS — BP 142/80 | HR 75 | Temp 97.9°F | Ht 73.0 in | Wt 229.0 lb

## 2021-12-09 DIAGNOSIS — R739 Hyperglycemia, unspecified: Secondary | ICD-10-CM | POA: Diagnosis not present

## 2021-12-09 DIAGNOSIS — Z0001 Encounter for general adult medical examination with abnormal findings: Secondary | ICD-10-CM

## 2021-12-09 DIAGNOSIS — Z125 Encounter for screening for malignant neoplasm of prostate: Secondary | ICD-10-CM | POA: Diagnosis not present

## 2021-12-09 DIAGNOSIS — E559 Vitamin D deficiency, unspecified: Secondary | ICD-10-CM | POA: Diagnosis not present

## 2021-12-09 DIAGNOSIS — E782 Mixed hyperlipidemia: Secondary | ICD-10-CM | POA: Diagnosis not present

## 2021-12-09 DIAGNOSIS — M1990 Unspecified osteoarthritis, unspecified site: Secondary | ICD-10-CM

## 2021-12-09 DIAGNOSIS — N1831 Chronic kidney disease, stage 3a: Secondary | ICD-10-CM

## 2021-12-09 DIAGNOSIS — E538 Deficiency of other specified B group vitamins: Secondary | ICD-10-CM

## 2021-12-09 MED ORDER — ROSUVASTATIN CALCIUM 40 MG PO TABS: 40.0000 mg | ORAL_TABLET | Freq: Every day | ORAL | 3 refills | Status: DC

## 2021-12-09 NOTE — Patient Instructions (Addendum)
Please take all new medication as prescribed - the crestor 40 mg per day  Please continue all other medications as before, and refills have been done if requested.  Please have the pharmacy call with any other refills you may need.  Please continue your efforts at being more active, low cholesterol diet, and weight control.  You are otherwise up to date with prevention measures today.  Please keep your appointments with your specialists as you may have planned  You will be contacted regarding the referral for: Rheumatology  Please make an Appointment to return for your 1 year visit, or sooner if needed, with Lab testing by Appointment as well, to be done about 3-5 days before at the [place] (so this is for TWO appointments - please see the scheduling desk as you leave)
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
## 2021-12-10 ENCOUNTER — Encounter: Payer: Self-pay | Admitting: Internal Medicine

## 2021-12-10 NOTE — Assessment & Plan Note (Signed)
Lab Results  Component Value Date   CREATININE 1.40 12/01/2021   Stable overall, cont to avoid nephrotoxins

## 2021-12-10 NOTE — Assessment & Plan Note (Signed)
Lab Results  Component Value Date   LDLCALC 133 (H) 11/18/2020   Uncontrolled, goal ldl < 100, pt to change statin to crestor 40 mg qd for better LDL lowering

## 2021-12-10 NOTE — Assessment & Plan Note (Signed)
oingoing issue, warned pt of long term side effect with chronic prednisone, pt for referral Rheumatology

## 2021-12-10 NOTE — Assessment & Plan Note (Signed)
Age and sex appropriate education and counseling updated with regular exercise and diet Referrals for preventative services - none needed Immunizations addressed - refuses all vaccinations Smoking counseling  - none needed Evidence for depression or other mood disorder - none significant Most recent labs reviewed. I have personally reviewed and have noted: 1) the patient's medical and social history 2) The patient's current medications and supplements 3) The patient's height, weight, and BMI have been recorded in the chart

## 2021-12-10 NOTE — Assessment & Plan Note (Signed)
Last vitamin D Lab Results  Component Value Date   VD25OH 20.01 (L) 12/01/2021   Low, to start oral replacement

## 2022-03-29 ENCOUNTER — Encounter: Payer: Medicare HMO | Admitting: Internal Medicine

## 2022-04-14 ENCOUNTER — Encounter: Payer: Self-pay | Admitting: Internal Medicine

## 2022-04-16 IMAGING — DX DG LUMBAR SPINE 2-3V
4 series · 4 of 4 positions shown · non-contrast
Comparison: None.

CLINICAL DATA: Pain, numbness in both legs

EXAM:
LUMBAR SPINE - 2-3 VIEW

[l-spine ap (1 of 2)]
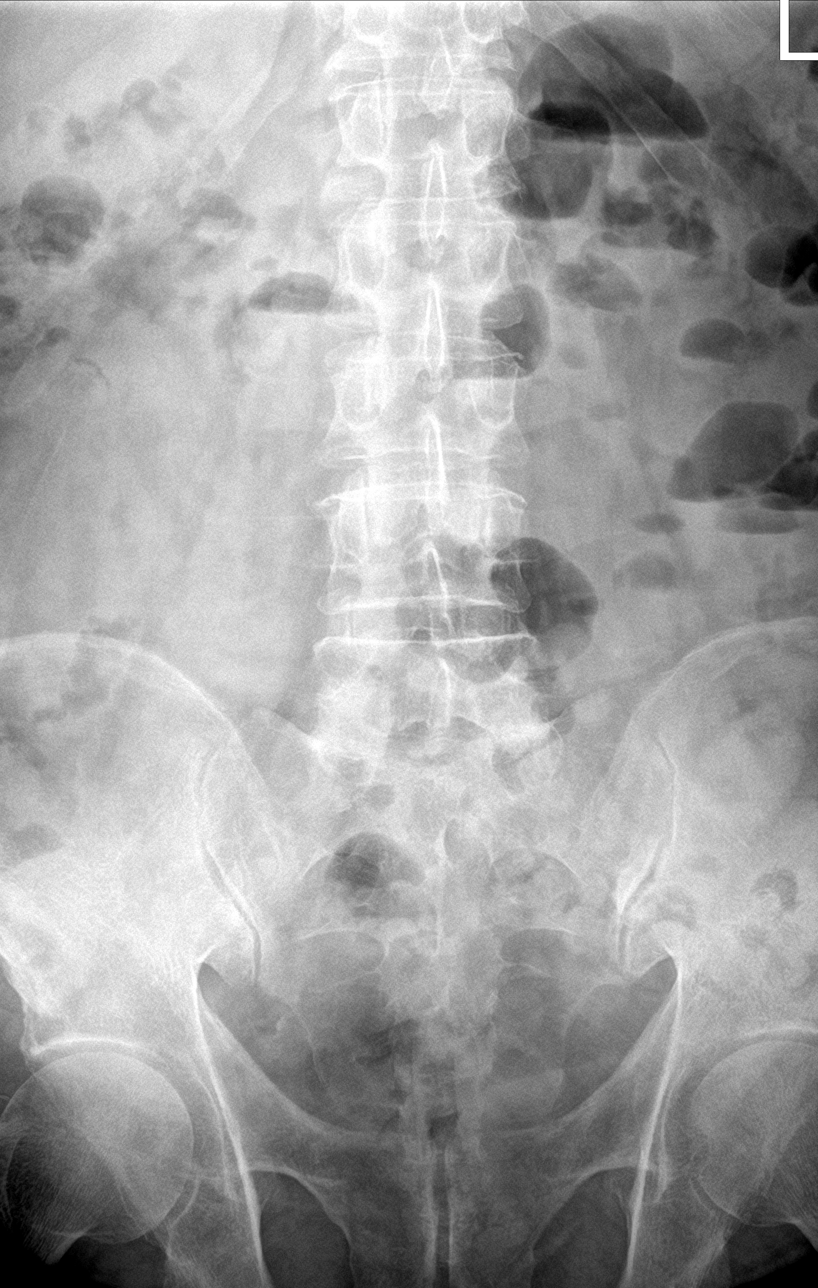

[l-spine lat]
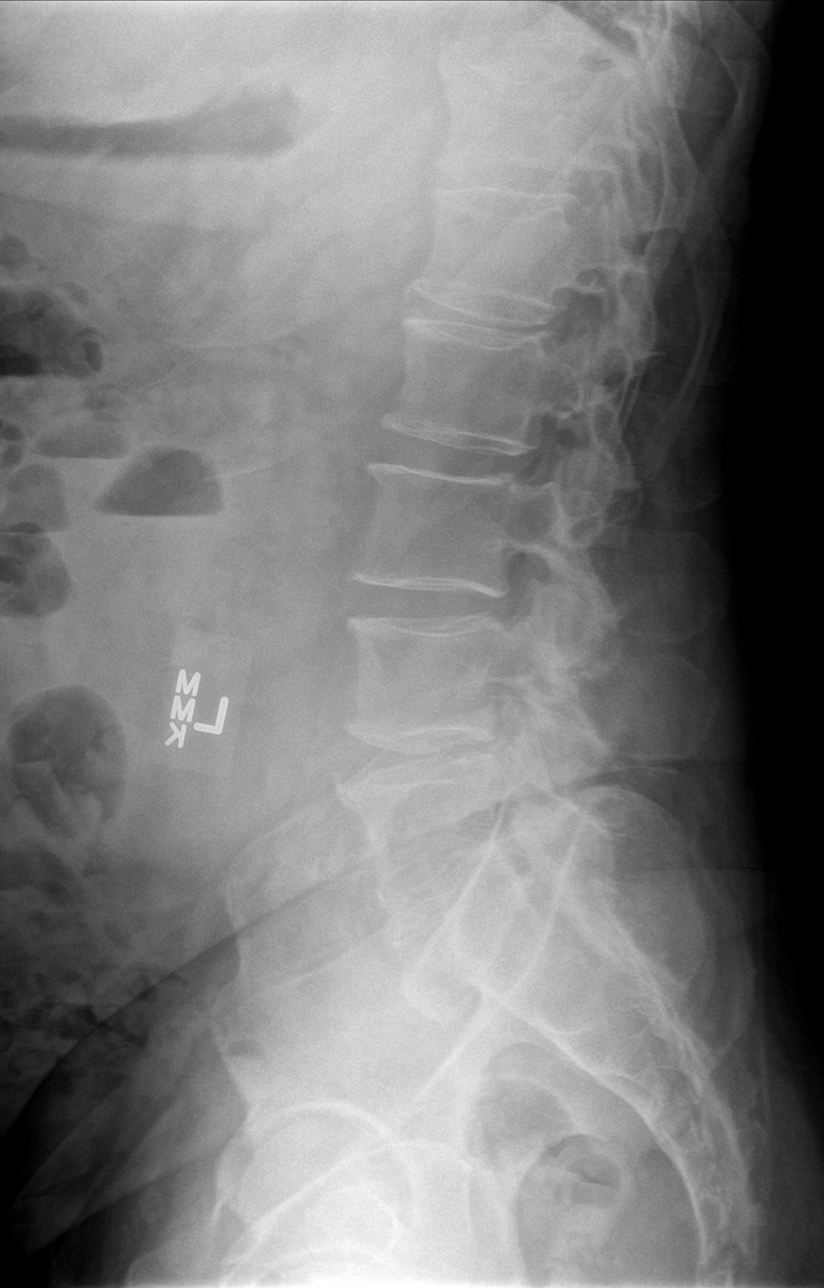

[l-spine spot]
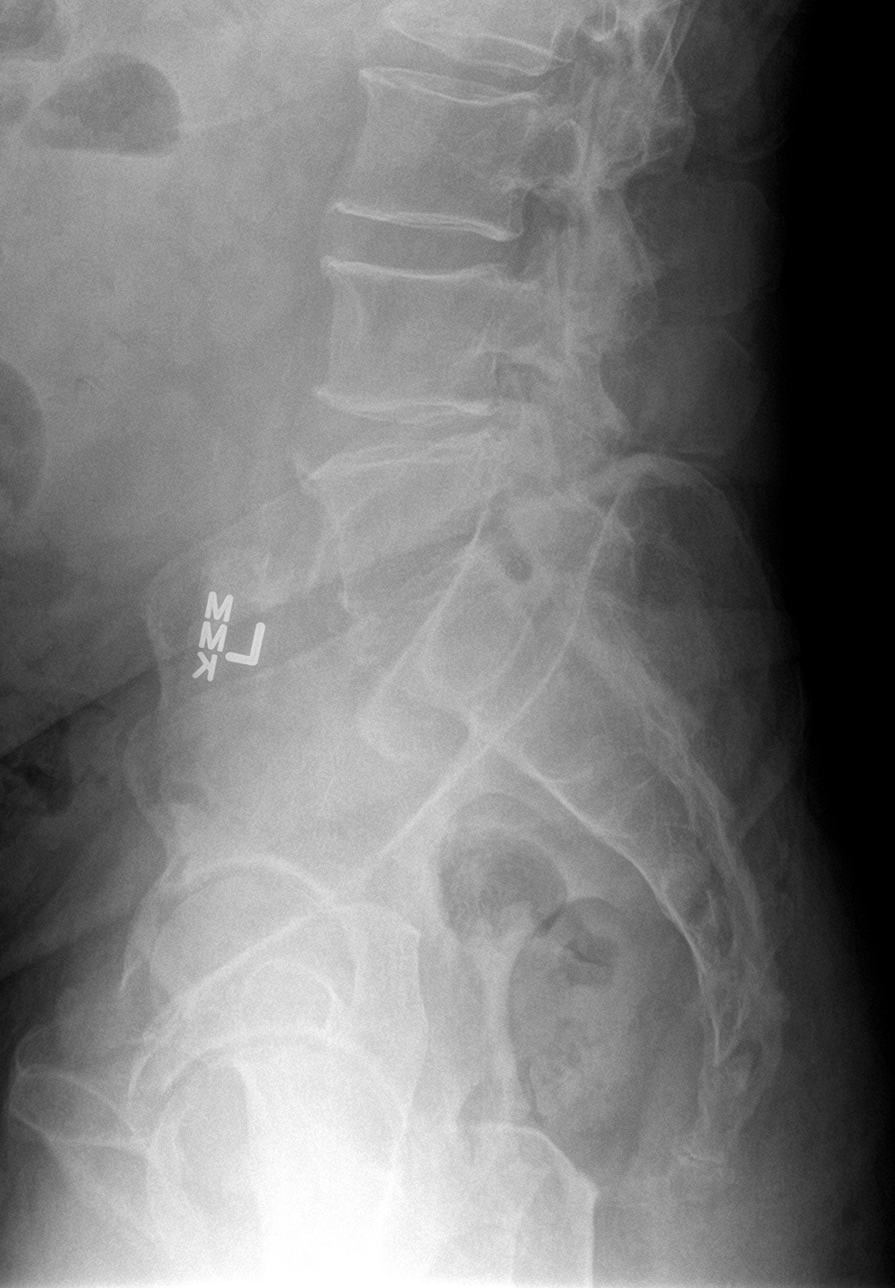

[l-spine ap (2 of 2)]
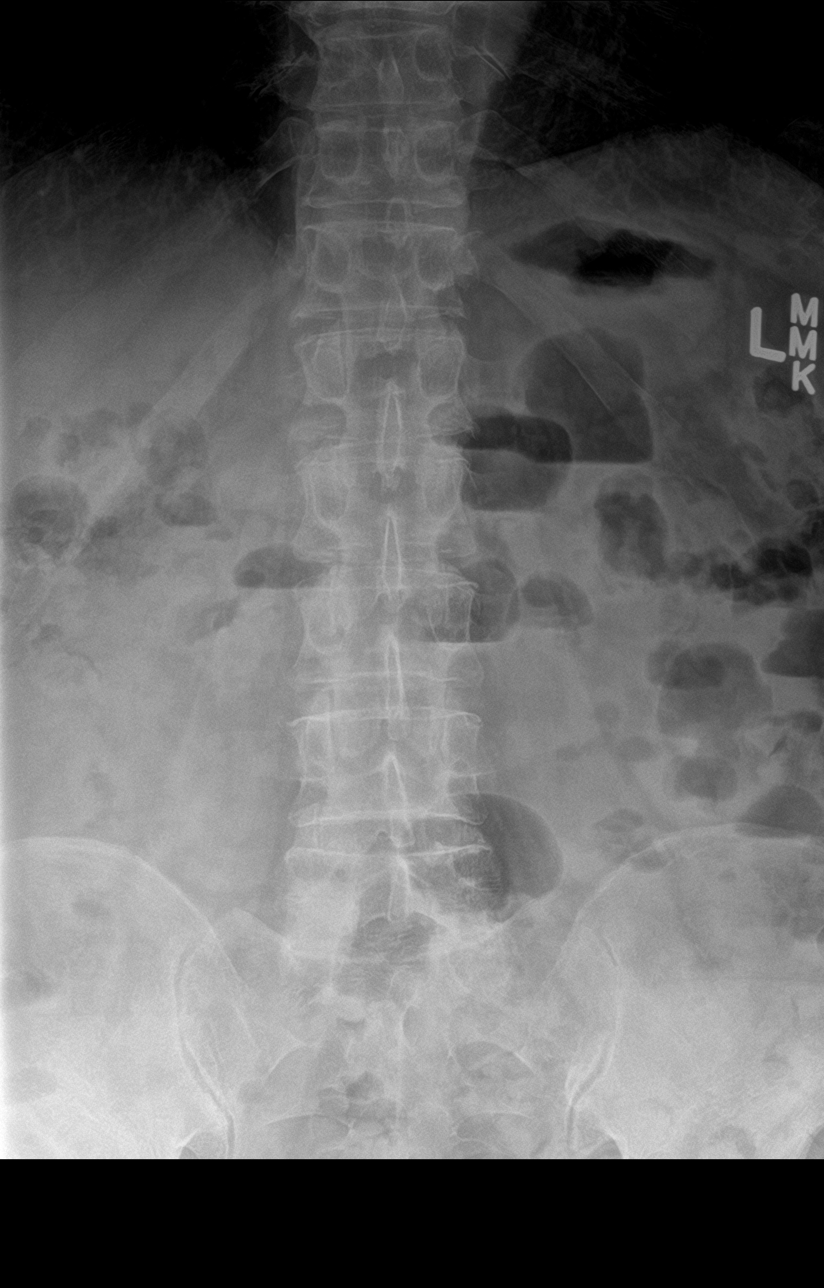

[4 of 4 positions shown; findings below may reference images not displayed]

FINDINGS: Degenerative disc and facet disease throughout the lumbar spine.
Normal alignment. No fracture. SI joints symmetric and unremarkable.
IMPRESSION: Degenerative disc and facet disease.  No acute bony abnormality.

## 2022-04-17 MED ORDER — DOXYCYCLINE HYCLATE 100 MG PO TABS: 100.0000 mg | ORAL_TABLET | Freq: Two times a day (BID) | ORAL | 0 refills | Status: DC

## 2022-05-13 DIAGNOSIS — L820 Inflamed seborrheic keratosis: Secondary | ICD-10-CM | POA: Diagnosis not present

## 2022-05-13 DIAGNOSIS — L719 Rosacea, unspecified: Secondary | ICD-10-CM | POA: Diagnosis not present

## 2022-05-13 DIAGNOSIS — D485 Neoplasm of uncertain behavior of skin: Secondary | ICD-10-CM | POA: Diagnosis not present

## 2022-08-04 DIAGNOSIS — L719 Rosacea, unspecified: Secondary | ICD-10-CM | POA: Diagnosis not present

## 2022-09-19 DIAGNOSIS — H40013 Open angle with borderline findings, low risk, bilateral: Secondary | ICD-10-CM | POA: Diagnosis not present

## 2022-09-19 DIAGNOSIS — H2513 Age-related nuclear cataract, bilateral: Secondary | ICD-10-CM | POA: Diagnosis not present

## 2022-11-29 ENCOUNTER — Other Ambulatory Visit: Payer: Self-pay | Admitting: Internal Medicine

## 2022-11-29 ENCOUNTER — Other Ambulatory Visit: Payer: Self-pay

## 2022-11-30 ENCOUNTER — Other Ambulatory Visit: Payer: Medicare HMO

## 2022-11-30 DIAGNOSIS — R739 Hyperglycemia, unspecified: Secondary | ICD-10-CM | POA: Diagnosis not present

## 2022-11-30 DIAGNOSIS — E538 Deficiency of other specified B group vitamins: Secondary | ICD-10-CM | POA: Diagnosis not present

## 2022-11-30 DIAGNOSIS — E782 Mixed hyperlipidemia: Secondary | ICD-10-CM

## 2022-11-30 DIAGNOSIS — M1990 Unspecified osteoarthritis, unspecified site: Secondary | ICD-10-CM

## 2022-11-30 DIAGNOSIS — Z125 Encounter for screening for malignant neoplasm of prostate: Secondary | ICD-10-CM

## 2022-11-30 DIAGNOSIS — E559 Vitamin D deficiency, unspecified: Secondary | ICD-10-CM | POA: Diagnosis not present

## 2022-11-30 DIAGNOSIS — M1380 Other specified arthritis, unspecified site: Secondary | ICD-10-CM

## 2022-11-30 LAB — LIPID PANEL
Cholesterol: 213 mg/dL — ABNORMAL HIGH (ref 0–200)
HDL: 30.4 mg/dL — ABNORMAL LOW (ref >39.00)
LDL Cholesterol: 139 mg/dL — ABNORMAL HIGH (ref 0–99)
NonHDL: 182.37
Total CHOL/HDL Ratio: 7
Triglycerides: 218 mg/dL — ABNORMAL HIGH (ref 0.0–149.0)
VLDL: 43.6 mg/dL — ABNORMAL HIGH (ref 0.0–40.0)

## 2022-11-30 LAB — CBC WITH DIFFERENTIAL/PLATELET
Basophils Absolute: 0.1 10*3/uL (ref 0.0–0.1)
Basophils Relative: 0.7 % (ref 0.0–3.0)
Eosinophils Absolute: 0 10*3/uL (ref 0.0–0.7)
Eosinophils Relative: 0.5 % (ref 0.0–5.0)
HCT: 42 % (ref 39.0–52.0)
Hemoglobin: 13.5 g/dL (ref 13.0–17.0)
Lymphocytes Relative: 12.4 % (ref 12.0–46.0)
Lymphs Abs: 1 10*3/uL (ref 0.7–4.0)
MCHC: 32.3 g/dL (ref 30.0–36.0)
MCV: 90.7 fL (ref 78.0–100.0)
Monocytes Absolute: 0.4 10*3/uL (ref 0.1–1.0)
Monocytes Relative: 4.9 % (ref 3.0–12.0)
Neutro Abs: 6.3 10*3/uL (ref 1.4–7.7)
Neutrophils Relative %: 81.5 % — ABNORMAL HIGH (ref 43.0–77.0)
Platelets: 170 10*3/uL (ref 150.0–400.0)
RBC: 4.63 Mil/uL (ref 4.22–5.81)
RDW: 15.8 % — ABNORMAL HIGH (ref 11.5–15.5)
WBC: 7.8 10*3/uL (ref 4.0–10.5)

## 2022-11-30 LAB — HEMOGLOBIN A1C: Hgb A1c MFr Bld: 5.3 % (ref 4.6–6.5)

## 2022-11-30 LAB — VITAMIN D 25 HYDROXY (VIT D DEFICIENCY, FRACTURES): VITD: 21.95 ng/mL — ABNORMAL LOW (ref 30.00–100.00)

## 2022-11-30 LAB — BASIC METABOLIC PANEL
BUN: 15 mg/dL (ref 6–23)
CO2: 28 meq/L (ref 19–32)
Calcium: 9.3 mg/dL (ref 8.4–10.5)
Chloride: 103 meq/L (ref 96–112)
Creatinine, Ser: 1.33 mg/dL (ref 0.40–1.50)
GFR: 52.64 mL/min — ABNORMAL LOW (ref >60.00)
Glucose, Bld: 100 mg/dL — ABNORMAL HIGH (ref 70–99)
Potassium: 4 meq/L (ref 3.5–5.1)
Sodium: 140 meq/L (ref 135–145)

## 2022-11-30 LAB — URINALYSIS, ROUTINE W REFLEX MICROSCOPIC
Bilirubin Urine: NEGATIVE
Hgb urine dipstick: NEGATIVE
Ketones, ur: NEGATIVE
Leukocytes,Ua: NEGATIVE
Nitrite: NEGATIVE
RBC / HPF: NONE SEEN (ref 0–?)
Specific Gravity, Urine: 1.01 (ref 1.000–1.030)
Total Protein, Urine: NEGATIVE
Urine Glucose: NEGATIVE
Urobilinogen, UA: 0.2 (ref 0.0–1.0)
pH: 6.5 (ref 5.0–8.0)

## 2022-11-30 LAB — TSH: TSH: 3.78 u[IU]/mL (ref 0.35–5.50)

## 2022-11-30 LAB — SEDIMENTATION RATE: Sed Rate: 23 mm/h — ABNORMAL HIGH (ref 0–20)

## 2022-11-30 LAB — PSA: PSA: 0.57 ng/mL (ref 0.10–4.00)

## 2022-11-30 LAB — HEPATIC FUNCTION PANEL
ALT: 27 U/L (ref 0–53)
AST: 17 U/L (ref 0–37)
Albumin: 4 g/dL (ref 3.5–5.2)
Alkaline Phosphatase: 109 U/L (ref 39–117)
Bilirubin, Direct: 0.2 mg/dL (ref 0.0–0.3)
Total Bilirubin: 1 mg/dL (ref 0.2–1.2)
Total Protein: 6.4 g/dL (ref 6.0–8.3)

## 2022-11-30 LAB — VITAMIN B12: Vitamin B-12: 186 pg/mL — ABNORMAL LOW (ref 211–911)

## 2022-12-04 VITALS — Ht 73.0 in | Wt 236.6 lb

## 2022-12-04 NOTE — Progress Notes (Signed)
This encounter was created in error - please disregard.

## 2022-12-04 NOTE — Patient Instructions (Signed)
Patient declined AWV 

## 2022-12-26 DIAGNOSIS — L219 Seborrheic dermatitis, unspecified: Secondary | ICD-10-CM | POA: Diagnosis not present

## 2022-12-26 DIAGNOSIS — L719 Rosacea, unspecified: Secondary | ICD-10-CM | POA: Diagnosis not present

## 2023-01-07 DIAGNOSIS — R110 Nausea: Secondary | ICD-10-CM | POA: Diagnosis not present

## 2023-01-07 DIAGNOSIS — H6123 Impacted cerumen, bilateral: Secondary | ICD-10-CM | POA: Diagnosis not present

## 2023-01-16 ENCOUNTER — Ambulatory Visit: Payer: Medicare HMO | Admitting: Internal Medicine

## 2023-01-22 ENCOUNTER — Other Ambulatory Visit: Payer: Self-pay | Admitting: Internal Medicine

## 2023-01-22 ENCOUNTER — Other Ambulatory Visit: Payer: Self-pay

## 2023-01-23 ENCOUNTER — Encounter: Payer: Self-pay | Admitting: Internal Medicine

## 2023-01-23 ENCOUNTER — Ambulatory Visit (INDEPENDENT_AMBULATORY_CARE_PROVIDER_SITE_OTHER): Payer: Medicare HMO | Admitting: Internal Medicine

## 2023-01-23 VITALS — BP 144/86 | HR 88 | Temp 98.1°F | Ht 73.0 in | Wt 229.0 lb

## 2023-01-23 DIAGNOSIS — Z0001 Encounter for general adult medical examination with abnormal findings: Secondary | ICD-10-CM

## 2023-01-23 DIAGNOSIS — I10 Essential (primary) hypertension: Secondary | ICD-10-CM

## 2023-01-23 DIAGNOSIS — E039 Hypothyroidism, unspecified: Secondary | ICD-10-CM

## 2023-01-23 DIAGNOSIS — E782 Mixed hyperlipidemia: Secondary | ICD-10-CM | POA: Diagnosis not present

## 2023-01-23 DIAGNOSIS — E538 Deficiency of other specified B group vitamins: Secondary | ICD-10-CM | POA: Insufficient documentation

## 2023-01-23 DIAGNOSIS — R739 Hyperglycemia, unspecified: Secondary | ICD-10-CM

## 2023-01-23 DIAGNOSIS — Z23 Encounter for immunization: Secondary | ICD-10-CM

## 2023-01-23 DIAGNOSIS — E559 Vitamin D deficiency, unspecified: Secondary | ICD-10-CM

## 2023-01-23 DIAGNOSIS — R1903 Right lower quadrant abdominal swelling, mass and lump: Secondary | ICD-10-CM | POA: Diagnosis not present

## 2023-01-23 DIAGNOSIS — R1013 Epigastric pain: Secondary | ICD-10-CM | POA: Insufficient documentation

## 2023-01-23 LAB — CBC WITH DIFFERENTIAL/PLATELET
Basophils Absolute: 0.1 10*3/uL (ref 0.0–0.1)
Basophils Relative: 0.9 % (ref 0.0–3.0)
Eosinophils Absolute: 0.2 10*3/uL (ref 0.0–0.7)
Eosinophils Relative: 2.4 % (ref 0.0–5.0)
HCT: 38.7 % — ABNORMAL LOW (ref 39.0–52.0)
Hemoglobin: 12.7 g/dL — ABNORMAL LOW (ref 13.0–17.0)
Lymphocytes Relative: 18.8 % (ref 12.0–46.0)
Lymphs Abs: 1.4 10*3/uL (ref 0.7–4.0)
MCHC: 32.7 g/dL (ref 30.0–36.0)
MCV: 90.5 fL (ref 78.0–100.0)
Monocytes Absolute: 0.6 10*3/uL (ref 0.1–1.0)
Monocytes Relative: 8.2 % (ref 3.0–12.0)
Neutro Abs: 5.1 10*3/uL (ref 1.4–7.7)
Neutrophils Relative %: 69.7 % (ref 43.0–77.0)
Platelets: 231 10*3/uL (ref 150.0–400.0)
RBC: 4.28 Mil/uL (ref 4.22–5.81)
RDW: 16.2 % — ABNORMAL HIGH (ref 11.5–15.5)
WBC: 7.4 10*3/uL (ref 4.0–10.5)

## 2023-01-23 LAB — BASIC METABOLIC PANEL
BUN: 18 mg/dL (ref 6–23)
CO2: 28 meq/L (ref 19–32)
Calcium: 9.1 mg/dL (ref 8.4–10.5)
Chloride: 105 meq/L (ref 96–112)
Creatinine, Ser: 1.4 mg/dL (ref 0.40–1.50)
GFR: 49.44 mL/min — ABNORMAL LOW (ref >60.00)
Glucose, Bld: 93 mg/dL (ref 70–99)
Potassium: 4.4 meq/L (ref 3.5–5.1)
Sodium: 140 meq/L (ref 135–145)

## 2023-01-23 LAB — HEPATIC FUNCTION PANEL
ALT: 12 U/L (ref 0–53)
AST: 13 U/L (ref 0–37)
Albumin: 3.9 g/dL (ref 3.5–5.2)
Alkaline Phosphatase: 113 U/L (ref 39–117)
Bilirubin, Direct: 0.1 mg/dL (ref 0.0–0.3)
Total Bilirubin: 1 mg/dL (ref 0.2–1.2)
Total Protein: 6.7 g/dL (ref 6.0–8.3)

## 2023-01-23 LAB — LIPASE: Lipase: 18 U/L (ref 11.0–59.0)

## 2023-01-23 MED ORDER — SUCRALFATE 1 G PO TABS: 1.0000 g | ORAL_TABLET | Freq: Four times a day (QID) | ORAL | 1 refills | Status: DC

## 2023-01-23 MED ORDER — PANTOPRAZOLE SODIUM 40 MG PO TBEC: 40.0000 mg | DELAYED_RELEASE_TABLET | Freq: Every day | ORAL | 3 refills | Status: DC

## 2023-01-23 NOTE — Assessment & Plan Note (Signed)
Lab Results  Component Value Date   TSH 3.78 11/30/2022   Stable, pt to continue levothyroxine 112 mcg qd
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
## 2023-01-23 NOTE — Assessment & Plan Note (Signed)
Unusual, high suspicion for malignancy - for CT abd pelvis

## 2023-01-23 NOTE — Assessment & Plan Note (Signed)
Etiology unclear, for start protonix 40 every day, carafate 1 gm qid, repeat cbc cmet, also for lipase and h pylori stool testing, and refer GI

## 2023-01-23 NOTE — Assessment & Plan Note (Signed)
Age and sex appropriate education and counseling updated with regular exercise and diet Referrals for preventative services - none needed Immunizations addressed - declines all Smoking counseling  - none needed Evidence for depression or other mood disorder - none significant Most recent labs reviewed. I have personally reviewed and have noted: 1) the patient's medical and social history 2) The patient's current medications and supplements 3) The patient's height, weight, and BMI have been recorded in the chart

## 2023-01-23 NOTE — Assessment & Plan Note (Signed)
BP Readings from Last 3 Encounters:  01/23/23 (!) 144/86  12/09/21 (!) 142/80  11/24/20 126/70   Uncontrolled, pt to continue medical treatment norvasc 5 every day, lotensin 40 with better compliance

## 2023-01-23 NOTE — Assessment & Plan Note (Signed)
Lab Results  Component Value Date   HGBA1C 5.3 11/30/2022   Stable, pt to continue current medical treatment  - diet, wt control

## 2023-01-23 NOTE — Assessment & Plan Note (Signed)
Last vitamin D Lab Results  Component Value Date   VD25OH 21.95 (L) 11/30/2022    Low, to start oral replacement

## 2023-01-23 NOTE — Assessment & Plan Note (Signed)
Lab Results  Component Value Date   VITAMINB12 186 (L) 11/30/2022   Low, to start oral replacement - b12 1000 mcg qd

## 2023-01-23 NOTE — Assessment & Plan Note (Signed)
Lab Results  Component Value Date   LDLCALC 139 (H) 11/30/2022   uncontrolled, pt to continue current statin crestor 40 mg every day with good compliiance

## 2023-01-23 NOTE — Patient Instructions (Signed)
You are given the handicapped parking application signed today  Please take all new medication as prescribed - the protonix and carafate for possible gastritis or ulcer  Please continue all other medications as before, and refills have been done if requested.  Please have the pharmacy call with any other refills you may need.  Please continue your efforts at being more active, low cholesterol diet, and weight control.  You are otherwise up to date with prevention measures today.  Please keep your appointments with your specialists as you may have planned  You will be contacted regarding the referral for: Gastroenterology, and CT scan  Please go to the LAB at the blood drawing area for the tests to be done  You will be contacted by phone if any changes need to be made immediately.  Otherwise, you will receive a letter about your results with an explanation, but please check with MyChart first.  Please make an Appointment to return in 3 months, or sooner if needed

## 2023-01-24 ENCOUNTER — Inpatient Hospital Stay
Admission: RE | Admit: 2023-01-24 | Discharge: 2023-01-24 | Discharge status: Home / Self Care | Payer: Medicare HMO | Source: Ambulatory Visit | Attending: Internal Medicine | Admitting: Internal Medicine

## 2023-01-24 DIAGNOSIS — R1013 Epigastric pain: Secondary | ICD-10-CM

## 2023-01-24 DIAGNOSIS — R1903 Right lower quadrant abdominal swelling, mass and lump: Secondary | ICD-10-CM | POA: Diagnosis not present

## 2023-01-24 MED ORDER — IOPAMIDOL (ISOVUE-300) INJECTION 61%
500.0000 mL | Freq: Once | INTRAVENOUS | Status: AC | PRN
  Administered 2023-01-24: 100 mL via INTRAVENOUS

## 2023-01-24 NOTE — Progress Notes (Signed)
The test results show that your current treatment is OK, as the tests are stable.  Please continue the same plan.  There is no other need for change of treatment or further evaluation based on these results, at this time.  thanks 

## 2023-01-25 ENCOUNTER — Other Ambulatory Visit: Payer: Self-pay | Admitting: Internal Medicine

## 2023-01-25 ENCOUNTER — Encounter: Payer: Self-pay | Admitting: Internal Medicine

## 2023-01-25 DIAGNOSIS — C641 Malignant neoplasm of right kidney, except renal pelvis: Secondary | ICD-10-CM

## 2023-01-25 DIAGNOSIS — I823 Embolism and thrombosis of renal vein: Secondary | ICD-10-CM

## 2023-01-25 MED ORDER — APIXABAN 5 MG PO TABS: ORAL_TABLET | ORAL | 0 refills | Status: DC

## 2023-01-25 MED ORDER — APIXABAN 5 MG PO TABS: 5.0000 mg | ORAL_TABLET | Freq: Two times a day (BID) | ORAL | 11 refills | Status: DC

## 2023-01-25 NOTE — Telephone Encounter (Signed)
D/w pt by phone - to start eliquis starter pack  Also to get call hopefully soon per [facility] regarding new pt appt for (very likely) new right renal cell ca

## 2023-01-29 DIAGNOSIS — C641 Malignant neoplasm of right kidney, except renal pelvis: Secondary | ICD-10-CM | POA: Diagnosis not present

## 2023-01-31 ENCOUNTER — Other Ambulatory Visit (HOSPITAL_COMMUNITY): Payer: Self-pay | Admitting: Urology

## 2023-01-31 DIAGNOSIS — C641 Malignant neoplasm of right kidney, except renal pelvis: Secondary | ICD-10-CM

## 2023-01-31 NOTE — Progress Notes (Signed)
Jay Dallas, MD  Claudean Kinds PROCEDURE / BIOPSY REVIEW Date: 01/31/23  Requested Biopsy site: Right renal mass Reason for request: renal ca, tissue typing Imaging review: Best seen on CT AP 01/24/23  Decision: Approved Imaging modality to perform: Ultrasound Schedule with: Moderate Sedation Schedule for: Any VIR  Additional comments: @VIR : Beware large variceal network along inferolateral aspect of mass.  Please contact me with questions, concerns, or if issue pertaining to this request arise.  Jay Dallas, MD [facility]       Previous Messages    ----- Message ----- From: Claudean Kinds Sent: 01/31/2023   2:17 PM EST To: Claudean Kinds; Ir Procedure Requests Subject: US Biopsy (kidney)                            Procedure : US Biopsy (kidney)  Reason : renal biopsy for tissue typing large renal mass Dx: Renal carcinoma, right (HCC) [C64.1 (ICD-10-CM)]    History: CT Abd/Pelvis w/  Provider: Loletta Parish., MD  Provider contact:  [phone]

## 2023-02-01 ENCOUNTER — Other Ambulatory Visit: Payer: Self-pay | Admitting: Urology

## 2023-02-01 DIAGNOSIS — C641 Malignant neoplasm of right kidney, except renal pelvis: Secondary | ICD-10-CM

## 2023-02-01 NOTE — Progress Notes (Signed)
[name], MD  Claudean Kinds Yes, ok to hold Eliquis x 48 hrs prior to procedure.  Thanks       Previous Messages    ----- Message ----- From: Claudean Kinds Sent: 01/31/2023   3:20 PM EST To: [name], MD; Claudean Kinds Subject: request to hold Eliquis                        Hi Dr. Jonny Ruiz ,  Patient is scheduled for a kidney biopsy on 02/26/23. Requesting ok to hold Eliquis for 48 hrs prior to procedure.  Thank you ,  [facility]  Centralized Scheduling for Imaging Services [facility] Sched[facility] Phone : [phone]  Fax: [phone]

## 2023-02-12 DIAGNOSIS — I7121 Aneurysm of the ascending aorta, without rupture: Secondary | ICD-10-CM | POA: Diagnosis not present

## 2023-02-12 DIAGNOSIS — R918 Other nonspecific abnormal finding of lung field: Secondary | ICD-10-CM | POA: Diagnosis not present

## 2023-02-12 DIAGNOSIS — R1901 Right upper quadrant abdominal swelling, mass and lump: Secondary | ICD-10-CM | POA: Diagnosis not present

## 2023-02-12 DIAGNOSIS — C641 Malignant neoplasm of right kidney, except renal pelvis: Secondary | ICD-10-CM | POA: Diagnosis not present

## 2023-02-21 ENCOUNTER — Ambulatory Visit
Admission: RE | Admit: 2023-02-21 | Discharge: 2023-02-21 | Disposition: A | Discharge status: Home / Self Care | Payer: HMO | Source: Ambulatory Visit | Attending: Urology | Admitting: Urology

## 2023-02-21 DIAGNOSIS — N289 Disorder of kidney and ureter, unspecified: Secondary | ICD-10-CM | POA: Diagnosis not present

## 2023-02-21 DIAGNOSIS — I823 Embolism and thrombosis of renal vein: Secondary | ICD-10-CM | POA: Diagnosis not present

## 2023-02-21 DIAGNOSIS — C641 Malignant neoplasm of right kidney, except renal pelvis: Secondary | ICD-10-CM

## 2023-02-21 MED ORDER — GADOPICLENOL 0.5 MMOL/ML IV SOLN
10.0000 mL | Freq: Once | INTRAVENOUS | Status: AC | PRN
  Administered 2023-02-21: 10 mL via INTRAVENOUS

## 2023-02-26 ENCOUNTER — Ambulatory Visit (HOSPITAL_COMMUNITY): Payer: HMO

## 2023-02-26 ENCOUNTER — Other Ambulatory Visit: Payer: Self-pay | Admitting: Radiology

## 2023-02-26 DIAGNOSIS — N2889 Other specified disorders of kidney and ureter: Secondary | ICD-10-CM

## 2023-02-27 ENCOUNTER — Ambulatory Visit (HOSPITAL_COMMUNITY)
Admission: RE | Admit: 2023-02-27 | Discharge: 2023-02-27 | Disposition: A | Discharge status: Home / Self Care | Payer: HMO | Source: Ambulatory Visit | Attending: Urology | Admitting: Urology

## 2023-02-27 ENCOUNTER — Other Ambulatory Visit: Payer: Self-pay

## 2023-02-27 DIAGNOSIS — C641 Malignant neoplasm of right kidney, except renal pelvis: Secondary | ICD-10-CM | POA: Diagnosis not present

## 2023-02-27 DIAGNOSIS — E039 Hypothyroidism, unspecified: Secondary | ICD-10-CM | POA: Diagnosis not present

## 2023-02-27 DIAGNOSIS — R110 Nausea: Secondary | ICD-10-CM | POA: Diagnosis present

## 2023-02-27 DIAGNOSIS — N2889 Other specified disorders of kidney and ureter: Secondary | ICD-10-CM

## 2023-02-27 DIAGNOSIS — E785 Hyperlipidemia, unspecified: Secondary | ICD-10-CM | POA: Insufficient documentation

## 2023-02-27 DIAGNOSIS — Z7901 Long term (current) use of anticoagulants: Secondary | ICD-10-CM | POA: Insufficient documentation

## 2023-02-27 DIAGNOSIS — I129 Hypertensive chronic kidney disease with stage 1 through stage 4 chronic kidney disease, or unspecified chronic kidney disease: Secondary | ICD-10-CM | POA: Diagnosis not present

## 2023-02-27 DIAGNOSIS — N183 Chronic kidney disease, stage 3 unspecified: Secondary | ICD-10-CM | POA: Diagnosis not present

## 2023-02-27 LAB — CBC
HCT: 42.7 % (ref 39.0–52.0)
Hemoglobin: 13.3 g/dL (ref 13.0–17.0)
MCH: 28.9 pg (ref 26.0–34.0)
MCHC: 31.1 g/dL (ref 30.0–36.0)
MCV: 92.6 fL (ref 80.0–100.0)
Platelets: 231 10*3/uL (ref 150–400)
RBC: 4.61 MIL/uL (ref 4.22–5.81)
RDW: 15.2 % (ref 11.5–15.5)
WBC: 3.7 10*3/uL — ABNORMAL LOW (ref 4.0–10.5)
nRBC: 0 % (ref 0.0–0.2)

## 2023-02-27 LAB — PROTIME-INR
INR: 1.1 (ref 0.8–1.2)
Prothrombin Time: 14.4 s (ref 11.4–15.2)

## 2023-02-27 MED ORDER — HYDRALAZINE HCL 20 MG/ML IJ SOLN
INTRAMUSCULAR | Status: AC | PRN
  Administered 2023-02-27 (×2): 10 mg via INTRAVENOUS

## 2023-02-27 MED ORDER — LIDOCAINE HCL (PF) 1 % IJ SOLN
10.0000 mL | Freq: Once | INTRAMUSCULAR | Status: AC
  Administered 2023-02-27: 10 mL via INTRADERMAL

## 2023-02-27 MED ORDER — SODIUM CHLORIDE 0.9 % IV SOLN: INTRAVENOUS | Status: DC

## 2023-02-27 MED ORDER — MIDAZOLAM HCL 2 MG/2ML IJ SOLN
INTRAMUSCULAR | Status: AC | PRN
  Administered 2023-02-27: 1 mg via INTRAVENOUS
  Administered 2023-02-27 (×2): .5 mg via INTRAVENOUS

## 2023-02-27 MED ORDER — GELATIN ABSORBABLE 12-7 MM EX MISC
1.0000 | Freq: Once | CUTANEOUS | Status: AC
  Administered 2023-02-27: 1 via TOPICAL

## 2023-02-27 MED ORDER — HYDRALAZINE HCL 20 MG/ML IJ SOLN
INTRAMUSCULAR | Status: AC
  Filled 2023-02-27: qty 1

## 2023-02-27 MED ORDER — FENTANYL CITRATE (PF) 100 MCG/2ML IJ SOLN
INTRAMUSCULAR | Status: AC | PRN
  Administered 2023-02-27 (×2): 50 ug via INTRAVENOUS

## 2023-02-27 MED ORDER — MIDAZOLAM HCL 2 MG/2ML IJ SOLN
INTRAMUSCULAR | Status: AC
  Filled 2023-02-27: qty 2

## 2023-02-27 MED ORDER — FENTANYL CITRATE (PF) 100 MCG/2ML IJ SOLN
INTRAMUSCULAR | Status: AC
  Filled 2023-02-27: qty 2

## 2023-02-27 MED ORDER — OXYCODONE HCL 5 MG PO TABS: 10.0000 mg | ORAL_TABLET | Freq: Four times a day (QID) | ORAL | Status: DC | PRN

## 2023-02-27 NOTE — Procedures (Signed)
 Vascular and Interventional Radiology Procedure Note  Patient: Jay Chapman DOB: 08-May-1950 Medical Record Number: [id number] Note Date/Time: 02/27/23 9:53 AM   Performing Physician: Thom Hall, MD Assistant(s): None  Diagnosis: R renal mass c/f RCC   Procedure: RIGHT RENAL MASS BIOPSY  Anesthesia: Conscious Sedation Complications: None Estimated Blood Loss: Minimal Specimens: Sent for Pathology  Findings:  Successful Ultrasound-guided biopsy of R renal mass. A total of 3 samples were obtained. Hemostasis of the tract was achieved using Gelfoam Slurry Embolization.  Plan: Bed rest for 2 hours.  See detailed procedure note with images in PACS. The patient tolerated the procedure well without incident or complication and was returned to Recovery in stable condition.    Thom Hall, MD [facility]   Pager. [phone] Clinic. [phone]
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
## 2023-03-01 ENCOUNTER — Encounter: Payer: Self-pay | Admitting: Urology

## 2023-03-01 LAB — SURGICAL PATHOLOGY

## 2023-03-02 DIAGNOSIS — C641 Malignant neoplasm of right kidney, except renal pelvis: Secondary | ICD-10-CM | POA: Insufficient documentation
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
## 2023-03-05 ENCOUNTER — Inpatient Hospital Stay: Payer: HMO

## 2023-03-05 ENCOUNTER — Telehealth: Payer: Self-pay

## 2023-03-05 ENCOUNTER — Other Ambulatory Visit (HOSPITAL_COMMUNITY): Payer: Self-pay

## 2023-03-05 ENCOUNTER — Telehealth: Payer: Self-pay | Admitting: Pharmacy Technician

## 2023-03-05 VITALS — BP 148/76 | HR 84 | Temp 97.7°F | Resp 19 | Wt 214.3 lb

## 2023-03-05 DIAGNOSIS — Z9189 Other specified personal risk factors, not elsewhere classified: Secondary | ICD-10-CM | POA: Insufficient documentation

## 2023-03-05 DIAGNOSIS — C7800 Secondary malignant neoplasm of unspecified lung: Secondary | ICD-10-CM | POA: Diagnosis not present

## 2023-03-05 DIAGNOSIS — I10 Essential (primary) hypertension: Secondary | ICD-10-CM | POA: Diagnosis not present

## 2023-03-05 DIAGNOSIS — E039 Hypothyroidism, unspecified: Secondary | ICD-10-CM | POA: Diagnosis not present

## 2023-03-05 DIAGNOSIS — C641 Malignant neoplasm of right kidney, except renal pelvis: Secondary | ICD-10-CM | POA: Diagnosis not present

## 2023-03-05 DIAGNOSIS — R110 Nausea: Secondary | ICD-10-CM | POA: Insufficient documentation

## 2023-03-05 DIAGNOSIS — M1990 Unspecified osteoarthritis, unspecified site: Secondary | ICD-10-CM

## 2023-03-05 LAB — CMP (CANCER CENTER ONLY)
ALT: 13 U/L (ref 0–44)
AST: 16 U/L (ref 15–41)
Albumin: 4.2 g/dL (ref 3.5–5.0)
Alkaline Phosphatase: 206 U/L — ABNORMAL HIGH (ref 38–126)
Anion gap: 8 (ref 5–15)
BUN: 17 mg/dL (ref 8–23)
CO2: 28 mmol/L (ref 22–32)
Calcium: 10.3 mg/dL (ref 8.9–10.3)
Chloride: 102 mmol/L (ref 98–111)
Creatinine: 1.71 mg/dL — ABNORMAL HIGH (ref 0.61–1.24)
GFR, Estimated: 41 mL/min — ABNORMAL LOW (ref >60)
Glucose, Bld: 112 mg/dL — ABNORMAL HIGH (ref 70–99)
Potassium: 4.3 mmol/L (ref 3.5–5.1)
Sodium: 138 mmol/L (ref 135–145)
Total Bilirubin: 0.8 mg/dL (ref 0.0–1.2)
Total Protein: 7.6 g/dL (ref 6.5–8.1)

## 2023-03-05 LAB — CBC WITH DIFFERENTIAL (CANCER CENTER ONLY)
Abs Immature Granulocytes: 0.02 10*3/uL (ref 0.00–0.07)
Basophils Absolute: 0.1 10*3/uL (ref 0.0–0.1)
Basophils Relative: 1 %
Eosinophils Absolute: 0 10*3/uL (ref 0.0–0.5)
Eosinophils Relative: 0 %
HCT: 40 % (ref 39.0–52.0)
Hemoglobin: 12.6 g/dL — ABNORMAL LOW (ref 13.0–17.0)
Immature Granulocytes: 0 %
Lymphocytes Relative: 11 %
Lymphs Abs: 0.6 10*3/uL — ABNORMAL LOW (ref 0.7–4.0)
MCH: 28.8 pg (ref 26.0–34.0)
MCHC: 31.5 g/dL (ref 30.0–36.0)
MCV: 91.5 fL (ref 80.0–100.0)
Monocytes Absolute: 0.4 10*3/uL (ref 0.1–1.0)
Monocytes Relative: 7 %
Neutro Abs: 4.9 10*3/uL (ref 1.7–7.7)
Neutrophils Relative %: 81 %
Platelet Count: 259 10*3/uL (ref 150–400)
RBC: 4.37 MIL/uL (ref 4.22–5.81)
RDW: 14.9 % (ref 11.5–15.5)
WBC Count: 6 10*3/uL (ref 4.0–10.5)
nRBC: 0 % (ref 0.0–0.2)

## 2023-03-05 LAB — LACTATE DEHYDROGENASE: LDH: 318 U/L — ABNORMAL HIGH (ref 98–192)

## 2023-03-05 LAB — T4, FREE: Free T4: 1.15 ng/dL — ABNORMAL HIGH (ref 0.61–1.12)

## 2023-03-05 MED ORDER — LENVATINIB (20 MG DAILY DOSE) 2 X 10 MG PO CPPK: 20.0000 mg | ORAL_CAPSULE | Freq: Every day | ORAL | 11 refills | Status: DC

## 2023-03-05 MED ORDER — PROCHLORPERAZINE MALEATE 10 MG PO TABS: 10.0000 mg | ORAL_TABLET | Freq: Four times a day (QID) | ORAL | 1 refills | Status: DC | PRN

## 2023-03-05 MED ORDER — LENVATINIB (10 MG DAILY DOSE) 10 MG PO CPPK
10.0000 mg | ORAL_CAPSULE | Freq: Every day | ORAL | 11 refills | Status: DC
  Filled 2023-03-06: qty 30, 30d supply, fill #0
  Filled 2023-03-27: qty 30, 30d supply, fill #1

## 2023-03-05 MED ORDER — ONDANSETRON HCL 8 MG PO TABS: 8.0000 mg | ORAL_TABLET | Freq: Three times a day (TID) | ORAL | 0 refills | Status: DC | PRN

## 2023-03-05 MED ORDER — OMEPRAZOLE 20 MG PO CPDR: 20.0000 mg | DELAYED_RELEASE_CAPSULE | Freq: Every day | ORAL | 1 refills | Status: AC

## 2023-03-05 NOTE — Assessment & Plan Note (Signed)
Due to large right-sided tumor and tumor thrombus Prescribed Compazine, and Zofran to use as needed today.

## 2023-03-05 NOTE — Assessment & Plan Note (Addendum)
Omeprazole 20 mg daily 30-60 minutes before breakfast.   Patient on apixaban, prednisone and TKI

## 2023-03-05 NOTE — Progress Notes (Signed)
START OFF PATHWAY REGIMEN - Renal Cell   [id number]:Lenvatinib 20 mg PO Daily D1-21 + Pembrolizumab 200 mg IV D1 q21 Days:   A cycle is every 21 days:     Lenvatinib      Pembrolizumab   **Always confirm dose/schedule in your pharmacy ordering system**  Patient Characteristics: Stage IV (Unresected T4M0 or Any T, M1)/Metastatic Disease, Clear Cell, First Line, Intermediate or Poor Risk Therapeutic Status: Stage IV (Unresected T4M0 or Any T, M1)/Metastatic Disease Histology: Clear Cell Line of Therapy: First Line Risk Status: Intermediate Risk Intent of Therapy: Non-Curative / Palliative Intent, Discussed with Patient

## 2023-03-05 NOTE — Assessment & Plan Note (Addendum)
Drug monitoring for lenvatinib. Monitor for hypertension with BP at home daily and in clinic after 1 week, then every 2 weeks for 2 months, and at least monthly thereafter.  Check lab for potential liver side effects: LFTs (at baseline, every 2 weeks for 2 months, and at least monthly thereafter) Check BMP, fT4, TSH for kidney function, electrolytes and thyroid function at baseline and monthly or as clinically indicated monitor for proteinuria at baseline and periodically during treatment (urine dipstick; if 2+ then obtain a 24-hour urine protein).  Non urgent ECG and Echo. ECG in select patients (congenital long QT syndrome, heart failure, bradyarrhythmias, or in those on concomitant medications known to prolong the QT interval).  Monitor for clinical signs/symptoms of cardiac dysfunction, arterial thrombosis, reversible posterior leukoencephalopathy syndrome (confirm with MRI), fistula formation, GI perforation, bleeding/hemorrhagic events, diarrhea, dehydration, and wound healing complications. Dental exam prior to and periodically during treatment.  Verify pregnancy status prior to treatment initiation (in patients who could become pregnant).  Monitor for signs of bleeding, wound healing, thrombosis Follow up one week after starting lenvatinib.

## 2023-03-05 NOTE — Telephone Encounter (Signed)
Oral Oncology Patient Advocate Encounter   Received notification that prior authorization for Lenvima is required.   PA submitted on 03/05/23 Key [id number] Status is pending     Jinger Neighbors, CPhT-Adv Oncology Pharmacy Patient Advocate [facility] Direct Number: [phone]  Fax: [phone]

## 2023-03-05 NOTE — Assessment & Plan Note (Addendum)
Recommend start lenvatinib asap Plan to add pembro as able Monitor with lab and exam closely for toxicities.

## 2023-03-05 NOTE — Telephone Encounter (Signed)
Oral Oncology Patient Advocate Encounter  Was successful in securing patient a $10,000 grant from [employer] to provide copayment coverage for Lenvima.  This will keep the out of pocket expense at $0.     [employer] ID: [id number]  I have spoken with the patient.   The billing information is as follows and has been shared with [facility]   RxBin: [id number] PCN: [id number] Member ID: [id number] Group ID: [id number] Dates of Eligibility: 02/03/23 through 02/02/24  Fund:  Renal Cell  Jinger Neighbors, CPhT-Adv Oncology Pharmacy Patient Advocate [facility] Direct Number: [phone]  Fax: [phone]

## 2023-03-05 NOTE — Telephone Encounter (Signed)
Oral Oncology Patient Advocate Encounter  Prior Authorization for Assunta Curtis has been approved.    PA# [id number] Effective dates: 03/05/23 through 03/04/24  Patients co-pay is $1,951.05.    Jinger Neighbors, CPhT-Adv Oncology Pharmacy Patient Advocate [facility] Direct Number: [phone]  Fax: [phone]

## 2023-03-05 NOTE — Assessment & Plan Note (Signed)
Currently on prednisone 10 mg daily We will need to monitor closely once starts immunotherapy.  Discussed risk and benefit.  He understands.

## 2023-03-05 NOTE — Telephone Encounter (Signed)
Oral Oncology Pharmacist Encounter  Received new prescription for Lenvima (lenvatinib) for the treatment of RCC in conjunction with pembrolizumab, planned duration until disease progression or unacceptable toxicity.  Labs from 03/05/2023 pending  assessed, ***.  Current medication list in Epic reviewed, no significant/ relevant DDIs with Lenvima identified.  Evaluated chart and no patient barriers to medication adherence noted.   Patient agreement for treatment documented in MD note on 03/05/2023.  Prescription has been e-scribed to the [facility] for benefits analysis and approval.  Oral Oncology Clinic will continue to follow for insurance authorization, copayment issues, initial counseling and start date.  Bethel Born, PharmD Hematology/Oncology Clinical Pharmacist [facility] Oral Chemotherapy Navigation Clinic [phone] 03/05/2023 2:43 PM

## 2023-03-05 NOTE — Assessment & Plan Note (Signed)
Monitor daily at rest at home

## 2023-03-05 NOTE — Patient Instructions (Signed)
Patient education  Dear Jay Chapman  We discussed your diagnosis of stage IV kidney cancer today. An effective treatment approved as of today is a combination of immunotherapy with targeted therapy.  I recommend we start this combination.  One of the drugs is a pill called lenvatinib you take daily at home and is a targeted therapy. The other drug is an immunotherapy using your immune system to fight cancers called pembrolizumab or Keytruda given by IV every 3 weeks in the infusion room.  Side effects of lenvatinib may include fatigue, diarrhea, arthralgia, myalgia, decreased appetite, nausea, vomiting, hypothyroidism, hypertension, mouth sores, rash, weight loss, dysphonia, proteinuria, hand foot syndrome, abdominal pain, bleeding, constipation, liver toxicity, headaches, kidney injury, blood clots, difficulty with wound healing, and rare osteonecrosis of the jaw, encephalopathy, heart failure, arrhythmia have been reported  Side effects of immunotherapy are mostly related to immune related reaction.  They depend on which organ may be affected.  Patient can have hyper or hypothyroidism, skin rash, fever, pneumonitis, hepatitis, carditis, colitis, nephritis or other endorgan damage from immune related reaction.  Severe fatal reaction such as carditis or encephalitis has been reported. Rarely severe side effects can result in death.  After discussion Jay Chapman understands and would like to proceed.   You will be scheduled for a teaching lession with our nurse to learn more about the drug, logistics and schedule for infusion appointment to start treatment.  IV treatment is given every 3 weeks.  Before each treatment you will be getting a lab draw, followed by visit with me to assess for side effects.  We will discuss follow-up surveillance after completion of treatment.  You should report any unusual side effects between visits.

## 2023-03-06 ENCOUNTER — Other Ambulatory Visit: Payer: Self-pay

## 2023-03-06 ENCOUNTER — Other Ambulatory Visit (HOSPITAL_COMMUNITY): Payer: Self-pay

## 2023-03-06 ENCOUNTER — Encounter: Payer: Self-pay | Admitting: Urology

## 2023-03-06 ENCOUNTER — Other Ambulatory Visit: Payer: Self-pay | Admitting: Pharmacy Technician

## 2023-03-06 ENCOUNTER — Telehealth: Payer: Self-pay

## 2023-03-06 LAB — TSH: TSH: 3.013 u[IU]/mL (ref 0.350–4.500)

## 2023-03-06 NOTE — Telephone Encounter (Signed)
TC to inform of the below message by Dr. Cherly Hensen. Wife answers phone and states pt is busy. Asked her to instruct pt to take in 60-70 oz of fluid per day, mostly during the morning to early afternoon hours. She verbalizes understanding and states she will inform pt.

## 2023-03-06 NOTE — Telephone Encounter (Signed)
-----   Message from Melven Sartorius sent at 03/05/2023  4:22 PM EST ----- Would you let him know fluid goal 60-70 oz daily and more during the morning hours, slow down at night time. Thanks.

## 2023-03-06 NOTE — Telephone Encounter (Signed)
Oral Chemotherapy Pharmacist Encounter  I spoke with patient for overview of: Lenvima (lenvatinib) for the treatment of RCC in conjunction with pembrolizumab, planned duration until disease progression or unacceptable toxicity.   Counseled patient on administration, dosing, side effects, monitoring, drug-food interactions, safe handling, storage, and disposal.  Patient will take Lenvima 10mg  capsules, 1 capsule (10mg ) by mouth once daily, with or without food, at approximately the same time each day.  Lenvima start date: 03/08/2023  Adverse effects include but are not limited to: hypertension, hand-foot syndrome, diarrhea, joint pain, fatigue, headache, decreased calcium, proteinuria, increased risk of blood clots, and cardiac conduction issues.   Patient will obtain anti diarrheal and alert the office of 4 or more loose stools above baseline.  Patient instructed to notify office of any upcoming invasive procedures.  Assunta Curtis will be held for 6 days prior to scheduled surgery, restart based on healing and clinical judgement.   Reviewed with patient importance of keeping a medication schedule and plan for any missed doses. No barriers to medication adherence identified.  Medication reconciliation performed and medication/allergy list updated.  Insurance authorization for Assunta Curtis has been obtained. Test claim at the pharmacy revealed copayment $0 for 1st fill of 30 days. This will ship from the [facility] outpatient pharmacy on 03/06/23 to deliver to patient's home on 03/07/23.  Patient informed the pharmacy will reach out 5-7 days prior to needing next fill of Lenvima to coordinate continued medication acquisition to prevent break in therapy.  All questions answered. Patient voiced understanding and appreciation.   Medication education handout placed in mail for patient. Patient knows to call the office with questions or concerns. Oral Chemotherapy Clinic phone number provided to patient.    Bethel Born, PharmD Hematology/Oncology Clinical Pharmacist [facility] Oral Chemotherapy Navigation Clinic [phone] 03/06/2023   1:07 PM

## 2023-03-06 NOTE — Progress Notes (Signed)
Specialty Pharmacy Initial Fill Coordination Note  Jay Chapman is a 73 y.o. male contacted today regarding refills of specialty medication(s) Lenvatinib Mesylate (LENVIMA) .  Patient requested Delivery  on 03/08/23  to verified address [address]  [place]  [id number]   Medication will be filled on 03/06/23.   Patient is aware of $0 copayment.

## 2023-03-06 NOTE — Progress Notes (Signed)
Patient counseled in telephone encounter opened on 03/05/2023.   Bethel Born, PharmD Hematology/Oncology Clinical Pharmacist [facility] Oral Chemotherapy Navigation Clinic [phone]

## 2023-03-07 ENCOUNTER — Other Ambulatory Visit: Payer: Self-pay

## 2023-03-08 ENCOUNTER — Telehealth: Payer: Self-pay

## 2023-03-12 ENCOUNTER — Inpatient Hospital Stay: Payer: HMO

## 2023-03-12 DIAGNOSIS — C7801 Secondary malignant neoplasm of right lung: Secondary | ICD-10-CM | POA: Diagnosis not present

## 2023-03-12 DIAGNOSIS — C7802 Secondary malignant neoplasm of left lung: Secondary | ICD-10-CM | POA: Diagnosis not present

## 2023-03-12 DIAGNOSIS — C641 Malignant neoplasm of right kidney, except renal pelvis: Secondary | ICD-10-CM | POA: Diagnosis not present

## 2023-03-13 ENCOUNTER — Ambulatory Visit (HOSPITAL_COMMUNITY): Payer: HMO

## 2023-03-13 NOTE — Progress Notes (Signed)
 Pharmacist Chemotherapy Monitoring - Initial Assessment    Anticipated start date: 03/20/23   The following has been reviewed per standard work regarding the patient's treatment regimen: The patient's diagnosis, treatment plan and drug doses, and organ/hematologic function Lab orders and baseline tests specific to treatment regimen  The treatment plan start date, drug sequencing, and pre-medications Prior authorization status  Patient's documented medication list, including drug-drug interaction screen and prescriptions for anti-emetics and supportive care specific to the treatment regimen The drug concentrations, fluid compatibility, administration routes, and timing of the medications to be used The patient's access for treatment and lifetime cumulative dose history, if applicable  The patient's medication allergies and previous infusion related reactions, if applicable   Changes made to treatment plan:  N/A  Follow up needed:  N/A  Amethyst Gainer, PharmD, MBA\

## 2023-03-14 ENCOUNTER — Ambulatory Visit (HOSPITAL_COMMUNITY)
Admission: RE | Admit: 2023-03-14 | Discharge: 2023-03-14 | Disposition: A | Discharge status: Home / Self Care | Payer: HMO | Source: Ambulatory Visit

## 2023-03-14 DIAGNOSIS — C641 Malignant neoplasm of right kidney, except renal pelvis: Secondary | ICD-10-CM | POA: Diagnosis not present

## 2023-03-14 DIAGNOSIS — G319 Degenerative disease of nervous system, unspecified: Secondary | ICD-10-CM | POA: Diagnosis not present

## 2023-03-14 DIAGNOSIS — C649 Malignant neoplasm of unspecified kidney, except renal pelvis: Secondary | ICD-10-CM | POA: Diagnosis not present

## 2023-03-14 MED ORDER — GADOBUTROL 1 MMOL/ML IV SOLN
10.0000 mL | Freq: Once | INTRAVENOUS | Status: AC | PRN
  Administered 2023-03-14: 10 mL via INTRAVENOUS

## 2023-03-15 ENCOUNTER — Inpatient Hospital Stay: Payer: HMO

## 2023-03-19 NOTE — Progress Notes (Addendum)
 Patient Care Team: Norleen Lynwood ORN, MD as PCP - General  Clinic Day:  03/20/2023  Referring physician: Tina Pauletta BROCKS, MD  ASSESSMENT & PLAN:   Assessment & Plan: Jay Chapman is a 73 y.o.male with history of hypertension, HLD being seen at [facility].   Diagnosis: Right RCC. Clear cell, G3. [id number] with lung metastases.   IMDC risk: Intermediate-risk group with 2 prognostic factors  Prognostic Factors  Yes: Less than one year from time of diagnosis to systemic therapy  No: Performance status <80% (Karnofsky)  No: Hemoglobin < lower limit of normal (Normal: 120 g/L or 12 g/dL)  No: Calcium  > upper limit of normal (Normal: 8.5-10.2 mg/dL)  No: Neutrophil > upper limit of normal (Normal: 2.0-7.010?/L)  No: Platelets > upper limit of normal (Normal: 150,000-400,000)  Treatment: 03/08/23 started Lenvatinib  10 mg  Overall he tolerated it well.  Inflammatory arthritis appear to flare after starting treatment and he self increase prednisone  to 10 mg daily with relief.  He has been on prednisone  reported for years and tolerating well.  He has no side effects.  Currently his inflammatory arthritis is under control at 10 mg daily.  He would like refills.  We discussed risks and benefit of adding immunotherapy, plus minus increased dose of TKI.  Discussed likelihood of increased toxicity with increasing dose given he has worsening arthritis.  Immunotherapy also has potential worsening of arthritis.  We also discussed alternative management will be saving immunotherapy for later when first TKI showed progression.  After discussion, he is comfortable proceeding with pembrolizumab  and monitor closely.  Renal cell carcinoma of right kidney (HCC) Continue lenvatinib   Plan to add pembro Monitor with lab and exam closely for toxicities.  At risk for side effect of medication Drug monitoring for lenvatinib . Monitor for hypertension with BP at home daily and in clinic after 1 week, then  every 2 weeks for 2 months, and at least monthly thereafter.  Check lab for potential liver side effects: LFTs (at baseline, every 2 weeks for 2 months, and at least monthly thereafter) Check BMP, fT4, TSH for kidney function, electrolytes and thyroid  function at baseline and monthly or as clinically indicated monitor for proteinuria at baseline and periodically during treatment (urine dipstick; if 2+ then obtain a 24-hour urine protein).  Non urgent ECG and Echo. ECG in select patients (congenital long QT syndrome, heart failure, bradyarrhythmias, or in those on concomitant medications known to prolong the QT interval).  Monitor for clinical signs/symptoms of cardiac dysfunction, arterial thrombosis, reversible posterior leukoencephalopathy syndrome (confirm with MRI), fistula formation, GI perforation, bleeding/hemorrhagic events, diarrhea, dehydration, and wound healing complications. Dental exam prior to and periodically during treatment.  Verify pregnancy status prior to treatment initiation (in patients who could become pregnant).  Monitor for signs of bleeding, wound healing, thrombosis Follow up one week after starting lenvatinib .  Essential hypertension Amlodipine  5 mg daily, benazepril  40 mg daily Continue to monitor at home  Inflammatory arthritis Was on prednisone  5 mg daily and increased to 10 mg daily with elbows, shoulder pain and subsided after increased. Refilled 10 mg daily today.  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  Prescribed today.   GI prophylaxis: PPI  Total time 49 minutes  The patient understands the plans discussed today and is in agreement with them.  He knows to contact our office if he develops concerns prior to his next appointment.  Pauletta BROCKS Tina, MD    [facility] - A DEPT OF [facility] [address] [place] [address] Dept:  [phone] Dept Fax: [phone]   CHIEF COMPLAINT:  CC: mRCC  INTERVAL HISTORY:  Jay Chapman is here today for repeat clinical assessment.   He started lenvatinib  on 1/23 and tolerating well.  BP at home is not too high. Walking outside.  No stomach pain, and appetite is fine. Nausea improved on compazine . No edema, palpitation, chest pain, short of breath, coughing, leg swelling, focal weakness, headache, bleeding, diarrhea, and wound.  No difficulty urinating and no bloody in the urine.  Dental issue: none.  No bleeding, bloody stool or dark stool on apixaban .  I have reviewed the past medical history, past surgical history, social history and family history with the patient and they are unchanged from previous note.  ALLERGIES:  is allergic to doxycycline  monohydrate.  MEDICATIONS:  Current Outpatient Medications  Medication Sig Dispense Refill   amLODipine  (NORVASC ) 5 MG tablet TAKE 1 TABLET EVERY DAY 90 tablet 3   apixaban  (ELIQUIS ) 5 MG TABS tablet Take 1 tablet (5 mg total) by mouth 2 (two) times daily. 60 tablet 3   benazepril  (LOTENSIN ) 40 MG tablet TAKE 1 TABLET EVERY DAY 90 tablet 3   Cholecalciferol  50 MCG (2000 UT) TABS 1 tab by mouth once daily 30 tablet 99   lenvatinib  10 mg daily dose (LENVIMA ) capsule Take 1 capsule (10 mg total) by mouth daily. 30 capsule 11   lenvatinib  20 mg daily dose (LENVIMA ) 2 x 10 MG capsule Take 2 capsules (20 mg total) by mouth daily. 60 capsule 11   levothyroxine  (SYNTHROID ) 112 MCG tablet TAKE 1 TABLET EVERY DAY 90 tablet 3   metroNIDAZOLE  (METROCREAM ) 0.75 % cream Apply topically 2 (two) times daily. 45 g 0   omeprazole  (PRILOSEC) 20 MG capsule Take 1 capsule (20 mg total) by mouth daily. 90 capsule 1   ondansetron  (ZOFRAN ) 8 MG tablet Take 8 mg by mouth every 8 (eight) hours as needed for nausea or vomiting.     ondansetron  (ZOFRAN ) 8 MG tablet Take 1 tablet (8 mg total) by mouth every 8 (eight) hours as needed for nausea or  vomiting. 20 tablet 0   predniSONE  (DELTASONE ) 10 MG tablet Take 1 tablet (10 mg total) by mouth daily with breakfast. 90 tablet 1   prochlorperazine  (COMPAZINE ) 10 MG tablet Take 1 tablet (10 mg total) by mouth every 6 (six) hours as needed for nausea or vomiting. 30 tablet 1   No current facility-administered medications for this visit.    HISTORY OF PRESENT ILLNESS:   Oncology History  Renal cell carcinoma of right kidney (HCC)  01/24/2023 Imaging   CT AP 20 cm right renal mass, consistent with renal cell carcinoma. This mass abuts the right lateral abdominal wall muscles, suspicious for invasion through Gerota's fascia.   Tumor thrombus within right renal vein and infrahepatic IVC.   No evidence of abdominal lymphadenopathy or pelvic metastatic disease.   Colonic diverticulosis, without radiographic evidence of diverticulitis.   02/12/2023 Imaging   CT chest Multiple bilateral pulm nodules largest 10 mm in the posterior left lower lobe.  Imaging features concerning for metastatic disease. 4.6 cm ascending thoracic aortic aneurysm.  Rec semiannual imaging follow-up Exophytic right renal mass with evidence of tumor thrombus in the IVC   02/21/2023 Imaging   MRI ABD IMPRESSION: 1. Huge solid and partially necrotic mass emanating from the lateral aspect of  the right kidney measuring up to approximately 19.9 x 16.2 x 16.4 cm and consistent with renal carcinoma. Enhancing portions of the mass are again noted to be quite vascular with prominent early enhancement and large capsular and central draining veins. 2. The main right renal vein is distended with enhancing tumor thrombus as seen by CT. Enhancing tumor thrombus extends into the IVC and ascends up towards the liver nearly to the intrahepatic IVC. Length of IVC tumor thrombus is approximately 5 cm and the thrombus occupies up to approximately 50% of the IVC lumen. 3. There appears to be a single right renal artery  without visualized accessory right-sided renal supply. 4. There is very little normal enhancing renal parenchyma on the right along the anterior aspect of the lower right kidney. Tumor extends to abut the lateral and anterolateral abdominal wall.     02/27/2023 Pathology Results   RIGHT RENAL MASS BIOPSY  A. KIDNEY, RIGHT MASS, NEEDLE CORE BIOPSY:  Renal cell carcinoma, clear-cell type, WHO grade 3.  See comment.    03/02/2023 Initial Diagnosis   Renal cell carcinoma of right kidney (HCC)   03/14/2023 Imaging   MRI brain: No acute intracranial process. No evidence of intracranial metastatic disease.   03/20/2023 -  Chemotherapy   Patient is on Treatment Plan : UTERINE Lenvatinib  (20) D1-21 + Pembrolizumab  (200) D1 q21d         REVIEW OF SYSTEMS:   All relevant systems were reviewed with the patient and are negative.   VITALS:  Blood pressure (!) 149/82, pulse 66, temperature 97.7 F (36.5 C), temperature source Temporal, resp. rate 16, weight 212 lb 11.2 oz (96.5 kg), SpO2 98%.  Wt Readings from Last 3 Encounters:  03/20/23 212 lb 11.2 oz (96.5 kg)  03/05/23 214 lb 4.8 oz (97.2 kg)  02/27/23 217 lb (98.4 kg)    Body mass index is 28.06 kg/m.  Performance status (ECOG): 1 - Symptomatic but completely ambulatory  PHYSICAL EXAM:   GENERAL:alert, no distress and comfortable SKIN: skin color normal, no rashes  EYES: normal, sclera clear OROPHARYNX: no exudate, no erythema    NECK: supple,  non-tender, without nodularity LYMPH:  no palpable cervical lymphadenopathy LUNGS: clear to auscultation with normal breathing effort.  No wheeze or rales HEART: regular rate & rhythm and no murmurs and no lower extremity edema ABDOMEN: abdomen soft, non-tender and nondistended. Right sided mass about 18 cm Musculoskeletal: no edema NEURO: alert, fluent speech, no focal motor/sensory deficits.  Strength and sensation equal bilaterally.  LABORATORY DATA:  I have reviewed the data as  listed    Component Value Date/Time   NA 139 03/20/2023 1125   K 3.9 03/20/2023 1125   CL 107 03/20/2023 1125   CO2 26 03/20/2023 1125   GLUCOSE 115 (H) 03/20/2023 1125   BUN 20 03/20/2023 1125   CREATININE 1.16 03/20/2023 1125   CREATININE 1.71 (H) 03/05/2023 1508   CALCIUM  9.2 03/20/2023 1125   PROT 6.7 03/20/2023 1125   ALBUMIN 3.8 03/20/2023 1125   AST 18 03/20/2023 1125   AST 16 03/05/2023 1508   ALT 17 03/20/2023 1125   ALT 13 03/05/2023 1508   ALKPHOS 138 (H) 03/20/2023 1125   BILITOT 0.7 03/20/2023 1125   BILITOT 0.8 03/05/2023 1508   GFRNONAA >60 03/20/2023 1125   GFRNONAA 41 (L) 03/05/2023 1508   GFRAA 88 10/22/2007 0857    No results found for: SPEP, UPEP  Lab Results  Component Value Date   WBC 7.2 03/20/2023  NEUTROABS 6.2 03/20/2023   HGB 12.5 (L) 03/20/2023   HCT 39.2 03/20/2023   MCV 89.1 03/20/2023   PLT 219 03/20/2023      Chemistry      Component Value Date/Time   NA 139 03/20/2023 1125   K 3.9 03/20/2023 1125   CL 107 03/20/2023 1125   CO2 26 03/20/2023 1125   BUN 20 03/20/2023 1125   CREATININE 1.16 03/20/2023 1125   CREATININE 1.71 (H) 03/05/2023 1508      Component Value Date/Time   CALCIUM  9.2 03/20/2023 1125   ALKPHOS 138 (H) 03/20/2023 1125   AST 18 03/20/2023 1125   AST 16 03/05/2023 1508   ALT 17 03/20/2023 1125   ALT 13 03/05/2023 1508   BILITOT 0.7 03/20/2023 1125   BILITOT 0.8 03/05/2023 1508       RADIOGRAPHIC STUDIES: I have personally reviewed the radiological images as listed and agreed with the findings in the report. MR Brain W Wo Contrast Result Date: 03/19/2023 CLINICAL DATA:  Kidney cancer, staging EXAM: MRI HEAD WITHOUT AND WITH CONTRAST TECHNIQUE: Multiplanar, multiecho pulse sequences of the brain and surrounding structures were obtained without and with intravenous contrast. CONTRAST:  10mL GADAVIST  GADOBUTROL  1 MMOL/ML IV SOLN COMPARISON:  None Available. FINDINGS: Brain: No restricted diffusion to  suggest acute or subacute infarct. No abnormal parenchymal or meningeal enhancement. No acute hemorrhage, mass, mass effect, or midline shift. No hydrocephalus or extra-axial collection. Pituitary and craniocervical junction within normal limits. No hemosiderin deposition to suggest remote hemorrhage. Advanced cerebral atrophy for age. No disproportionate lobar atrophy Vascular: Normal arterial flow voids. Normal arterial and venous enhancement. Skull and upper cervical spine: Normal marrow signal. Sinuses/Orbits: Clear paranasal sinuses. No acute finding in the orbits. IMPRESSION: No acute intracranial process. No evidence of intracranial metastatic disease. Electronically Signed   By: Donald Campion M.D.   On: 03/19/2023 15:24   US  BIOPSY (KIDNEY) Result Date: 02/27/2023 INDICATION: Tissue typing large renal mass EXAM: ULTRASOUND GUIDED RIGHT RENAL MASS BIOPSY COMPARISON:  CT AP, 01/24/2019.  MR abdomen, 02/21/2023. MEDICATIONS: Hydralazine  20 mg IV ANESTHESIA/SEDATION: Moderate (conscious) sedation was employed during this procedure. A total of Versed  2 mg and Fentanyl  100 mcg was administered intravenously. Moderate Sedation Time: 16 minutes. The patient's level of consciousness and vital signs were monitored continuously by radiology nursing throughout the procedure under my direct supervision. COMPLICATIONS: None immediate. PROCEDURE: Informed written consent was obtained from insufficient wrap after a discussion of the risks, benefits and alternatives to treatment. The patient understands and consents the procedure. A timeout was performed prior to the initiation of the procedure. Ultrasound scanning was performed of the right flank demonstrates a large, hypervascular renal mass The RIGHT renal mass was selected for biopsy and the procedure was planned. The right flank was prepped and draped in the usual sterile fashion. The overlying soft tissues were anesthetized with 1% lidocaine . A 17 gauge, 6.8 cm  co-axial needle was advanced into a peripheral aspect of the lesion. This was followed by 4 core biopsies with an 18 gauge core device under direct ultrasound guidance. The coaxial needle tract was embolized with a small amount of Gel-Foam slurry and superficial hemostasis was obtained with manual compression. Post procedural scanning was negative for definitive area of hemorrhage or additional complication. A dressing was placed. The patient tolerated the procedure well without immediate post procedural complication. IMPRESSION: 1. Large, hypervascular RIGHT renal mass. 2. Successful ultrasound guided core needle biopsy of the RIGHT renal mass. Thom Hall, MD  [facility] Radiology Electronically Signed   By: Thom Hall M.D.   On: 02/27/2023 20:20   MR ANGIO ABDOMEN W WO CONTRAST Result Date: 02/22/2023 CLINICAL DATA:  Large right renal carcinoma with CT demonstrating tumor bulk mint in the right renal vein extending into the IVC. EXAM: MRA ABDOMEN AND PELVIS WITH CONTRAST TECHNIQUE: Multiplanar, multiecho pulse sequences of the abdomen and pelvis were obtained with intravenous contrast. Angiographic images of abdomen and pelvis were obtained using MRA technique with intravenous contrast. CONTRAST:  10 mL IV Vueway  COMPARISON:  CT of the abdomen and pelvis on 01/24/2023 FINDINGS: Huge solid and partially necrotic mass is again seen emanating from the lateral aspect of the right kidney and measuring up to approximately 19.9 x 16.2 x 16.4 cm. Enhancing portions of the mass are again noted to be quite vascular with prominent early enhancement and large capsular and central draining veins. The main right renal vein is distended with enhancing tumor thrombus as seen by CT. Enhancing tumor thrombus extends into the IVC and ascends up towards the liver nearly to the intrahepatic IVC. Length of IVC tumor thrombus is approximately 5 cm and the thrombus occupies up to  approximately 50% of the IVC lumen. There appears to be a single right renal artery without visualized accessory right-sided renal supply. There is very little normal enhancing renal parenchyma on the right along the anterior aspect of the lower right kidney. Tumor extends to abut the lateral and anterolateral abdominal wall. No discrete metastatic lymphadenopathy identified. No evidence of ascites or abnormal focal fluid collections. No evidence of bowel obstruction. IMPRESSION: 1. Huge solid and partially necrotic mass emanating from the lateral aspect of the right kidney measuring up to approximately 19.9 x 16.2 x 16.4 cm and consistent with renal carcinoma. Enhancing portions of the mass are again noted to be quite vascular with prominent early enhancement and large capsular and central draining veins. 2. The main right renal vein is distended with enhancing tumor thrombus as seen by CT. Enhancing tumor thrombus extends into the IVC and ascends up towards the liver nearly to the intrahepatic IVC. Length of IVC tumor thrombus is approximately 5 cm and the thrombus occupies up to approximately 50% of the IVC lumen. 3. There appears to be a single right renal artery without visualized accessory right-sided renal supply. 4. There is very little normal enhancing renal parenchyma on the right along the anterior aspect of the lower right kidney. Tumor extends to abut the lateral and anterolateral abdominal wall. Electronically Signed   By: Marcey Moan M.D.   On: 02/22/2023 13:31

## 2023-03-19 NOTE — Assessment & Plan Note (Signed)
Amlodipine 5 mg daily, benazepril 40 mg daily Continue to monitor at home

## 2023-03-19 NOTE — Assessment & Plan Note (Signed)
Continue lenvatinib  Plan to add pembro Monitor with lab and exam closely for toxicities.

## 2023-03-19 NOTE — Assessment & Plan Note (Signed)
 Drug monitoring for lenvatinib. Monitor for hypertension with BP at home daily and in clinic after 1 week, then every 2 weeks for 2 months, and at least monthly thereafter.  Check lab for potential liver side effects: LFTs (at baseline, every 2 weeks for 2 months, and at least monthly thereafter) Check BMP, fT4, TSH for kidney function, electrolytes and thyroid function at baseline and monthly or as clinically indicated monitor for proteinuria at baseline and periodically during treatment (urine dipstick; if 2+ then obtain a 24-hour urine protein).  Non urgent ECG and Echo. ECG in select patients (congenital long QT syndrome, heart failure, bradyarrhythmias, or in those on concomitant medications known to prolong the QT interval).  Monitor for clinical signs/symptoms of cardiac dysfunction, arterial thrombosis, reversible posterior leukoencephalopathy syndrome (confirm with MRI), fistula formation, GI perforation, bleeding/hemorrhagic events, diarrhea, dehydration, and wound healing complications. Dental exam prior to and periodically during treatment.  Verify pregnancy status prior to treatment initiation (in patients who could become pregnant).  Monitor for signs of bleeding, wound healing, thrombosis Follow up one week after starting lenvatinib.

## 2023-03-20 ENCOUNTER — Other Ambulatory Visit (HOSPITAL_COMMUNITY): Payer: Self-pay

## 2023-03-20 ENCOUNTER — Other Ambulatory Visit: Payer: Self-pay | Admitting: *Deleted

## 2023-03-20 ENCOUNTER — Inpatient Hospital Stay (HOSPITAL_BASED_OUTPATIENT_CLINIC_OR_DEPARTMENT_OTHER): Payer: HMO

## 2023-03-20 ENCOUNTER — Inpatient Hospital Stay: Payer: HMO

## 2023-03-20 ENCOUNTER — Inpatient Hospital Stay: Payer: HMO | Admitting: Nutrition

## 2023-03-20 ENCOUNTER — Encounter (HOSPITAL_COMMUNITY): Payer: Self-pay

## 2023-03-20 VITALS — BP 149/82 | HR 66 | Temp 97.7°F | Resp 16 | Wt 212.7 lb

## 2023-03-20 VITALS — BP 160/89 | HR 76 | Temp 98.1°F | Resp 16

## 2023-03-20 DIAGNOSIS — D499 Neoplasm of unspecified behavior of unspecified site: Secondary | ICD-10-CM | POA: Diagnosis not present

## 2023-03-20 DIAGNOSIS — Z5112 Encounter for antineoplastic immunotherapy: Secondary | ICD-10-CM | POA: Insufficient documentation

## 2023-03-20 DIAGNOSIS — C7800 Secondary malignant neoplasm of unspecified lung: Secondary | ICD-10-CM | POA: Diagnosis not present

## 2023-03-20 DIAGNOSIS — I10 Essential (primary) hypertension: Secondary | ICD-10-CM | POA: Diagnosis not present

## 2023-03-20 DIAGNOSIS — E785 Hyperlipidemia, unspecified: Secondary | ICD-10-CM | POA: Insufficient documentation

## 2023-03-20 DIAGNOSIS — C641 Malignant neoplasm of right kidney, except renal pelvis: Secondary | ICD-10-CM | POA: Insufficient documentation

## 2023-03-20 DIAGNOSIS — Z79620 Long term (current) use of immunosuppressive biologic: Secondary | ICD-10-CM | POA: Insufficient documentation

## 2023-03-20 DIAGNOSIS — M1990 Unspecified osteoarthritis, unspecified site: Secondary | ICD-10-CM

## 2023-03-20 DIAGNOSIS — I82220 Acute embolism and thrombosis of inferior vena cava: Secondary | ICD-10-CM | POA: Diagnosis not present

## 2023-03-20 DIAGNOSIS — Z9189 Other specified personal risk factors, not elsewhere classified: Secondary | ICD-10-CM

## 2023-03-20 LAB — TSH: TSH: 1.111 u[IU]/mL (ref 0.350–4.500)

## 2023-03-20 LAB — CBC WITH DIFFERENTIAL (CANCER CENTER ONLY)
Abs Immature Granulocytes: 0.03 10*3/uL (ref 0.00–0.07)
Basophils Absolute: 0 10*3/uL (ref 0.0–0.1)
Basophils Relative: 1 %
Eosinophils Absolute: 0.1 10*3/uL (ref 0.0–0.5)
Eosinophils Relative: 1 %
HCT: 39.2 % (ref 39.0–52.0)
Hemoglobin: 12.5 g/dL — ABNORMAL LOW (ref 13.0–17.0)
Immature Granulocytes: 0 %
Lymphocytes Relative: 8 %
Lymphs Abs: 0.6 10*3/uL — ABNORMAL LOW (ref 0.7–4.0)
MCH: 28.4 pg (ref 26.0–34.0)
MCHC: 31.9 g/dL (ref 30.0–36.0)
MCV: 89.1 fL (ref 80.0–100.0)
Monocytes Absolute: 0.4 10*3/uL (ref 0.1–1.0)
Monocytes Relative: 5 %
Neutro Abs: 6.2 10*3/uL (ref 1.7–7.7)
Neutrophils Relative %: 85 %
Platelet Count: 219 10*3/uL (ref 150–400)
RBC: 4.4 MIL/uL (ref 4.22–5.81)
RDW: 14.7 % (ref 11.5–15.5)
WBC Count: 7.2 10*3/uL (ref 4.0–10.5)
nRBC: 0 % (ref 0.0–0.2)

## 2023-03-20 LAB — COMPREHENSIVE METABOLIC PANEL
ALT: 17 U/L (ref 0–44)
AST: 18 U/L (ref 15–41)
Albumin: 3.8 g/dL (ref 3.5–5.0)
Alkaline Phosphatase: 138 U/L — ABNORMAL HIGH (ref 38–126)
Anion gap: 6 (ref 5–15)
BUN: 20 mg/dL (ref 8–23)
CO2: 26 mmol/L (ref 22–32)
Calcium: 9.2 mg/dL (ref 8.9–10.3)
Chloride: 107 mmol/L (ref 98–111)
Creatinine, Ser: 1.16 mg/dL (ref 0.61–1.24)
GFR, Estimated: >60 mL/min (ref >60)
Glucose, Bld: 115 mg/dL — ABNORMAL HIGH (ref 70–99)
Potassium: 3.9 mmol/L (ref 3.5–5.1)
Sodium: 139 mmol/L (ref 135–145)
Total Bilirubin: 0.7 mg/dL (ref 0.0–1.2)
Total Protein: 6.7 g/dL (ref 6.5–8.1)

## 2023-03-20 LAB — C-REACTIVE PROTEIN: CRP: 0.7 mg/dL (ref <1.0)

## 2023-03-20 LAB — LACTATE DEHYDROGENASE: LDH: 295 U/L — ABNORMAL HIGH (ref 98–192)

## 2023-03-20 LAB — T4, FREE: Free T4: 1.47 ng/dL — ABNORMAL HIGH (ref 0.61–1.12)

## 2023-03-20 MED ORDER — PREDNISONE 10 MG PO TABS
10.0000 mg | ORAL_TABLET | Freq: Every day | ORAL | 1 refills | Status: DC
  Filled 2023-03-20 (×2): qty 90, 90d supply, fill #0

## 2023-03-20 MED ORDER — SODIUM CHLORIDE 0.9 % IV SOLN: INTRAVENOUS | Status: DC

## 2023-03-20 MED ORDER — APIXABAN 5 MG PO TABS
5.0000 mg | ORAL_TABLET | Freq: Two times a day (BID) | ORAL | 3 refills | Status: DC
  Filled 2023-03-20 (×2): qty 60, 30d supply, fill #0
  Filled 2023-04-15: qty 60, 30d supply, fill #1

## 2023-03-20 MED ORDER — SODIUM CHLORIDE 0.9 % IV SOLN
200.0000 mg | Freq: Once | INTRAVENOUS | Status: AC
  Administered 2023-03-20: 200 mg via INTRAVENOUS
  Filled 2023-03-20: qty 200

## 2023-03-20 NOTE — Progress Notes (Signed)
 Patient identified to be at risk for malnutrition on the MST report.  73 year old male diagnosed with renal cell cancer, metastases to lung.  He is followed by Dr. Tina.  He is receiving Lenvatinib  and Pembrolizumab .  Past medical history includes hypertension, hyperlipidemia, anxiety, diverticulosis, fatigue, inflammatory arthritis, obesity, and sleep apnea.  Medications include Synthroid , Prilosec, Zofran , prednisone , and Compazine .  Labs include glucose 115.  Height: 6 feet 1 inch. Weight: 212 pounds 11.2 ounces. Usual body weight: Patient weighed 236 pounds October 2024. BMI: 28.06.  Patient endorses unintentional weight loss.  He states he just had no appetite and had difficulty eating.  States he was having some nausea however it is improved on antiemetic medications.  His appetite has increased and he feels like he is eating much better.  He would like to know which foods have additional protein in them.  Patient feels confident he can maintain his current weight.  Nutrition diagnosis: Unintentional weight loss related to cancer and associated treatments as evidenced by 10% weight loss in less than 6 months.  Intervention: Educated on importance of small frequent meals and snacks with adequate calories and protein for weight maintenance. Reviewed high-protein foods. Provided nutrition facts sheets.  Provided contact information.  Monitoring, evaluation, goals: Patient will tolerate adequate calories and protein for weight maintenance.  No follow-up scheduled at this time.  Patient has contact information and agrees to call RD with questions or concerns.

## 2023-03-20 NOTE — Assessment & Plan Note (Signed)
Continue apixaban 5 mg twice daily.  Prescribed today.

## 2023-03-20 NOTE — Patient Instructions (Signed)
 [facility] - A DEPT OF [facility]  Discharge Instructions: Thank you for choosing [facility] to provide your oncology and hematology care.   If you have a lab appointment with the Cancer Center, please go directly to the Cancer Center and check in at the registration area.   Wear comfortable clothing and clothing appropriate for easy access to any Portacath or PICC line.   We strive to give you quality time with your provider. You may need to reschedule your appointment if you arrive late (15 or more minutes).  Arriving late affects you and other patients whose appointments are after yours.  Also, if you miss three or more appointments without notifying the office, you may be dismissed from the clinic at the provider's discretion.      For prescription refill requests, have your pharmacy contact our office and allow 72 hours for refills to be completed.    Today you received the following chemotherapy and/or immunotherapy agents: Keytruda  (Pembrolizumab )      To help prevent nausea and vomiting after your treatment, we encourage you to take your nausea medication as directed.  BELOW ARE SYMPTOMS THAT SHOULD BE REPORTED IMMEDIATELY: *FEVER GREATER THAN 100.4 F (38 C) OR HIGHER *CHILLS OR SWEATING *NAUSEA AND VOMITING THAT IS NOT CONTROLLED WITH YOUR NAUSEA MEDICATION *UNUSUAL SHORTNESS OF BREATH *UNUSUAL BRUISING OR BLEEDING *URINARY PROBLEMS (pain or burning when urinating, or frequent urination) *BOWEL PROBLEMS (unusual diarrhea, constipation, pain near the anus) TENDERNESS IN MOUTH AND THROAT WITH OR WITHOUT PRESENCE OF ULCERS (sore throat, sores in mouth, or a toothache) UNUSUAL RASH, SWELLING OR PAIN  UNUSUAL VAGINAL DISCHARGE OR ITCHING   Items with * indicate a potential emergency and should be followed up as soon as possible or go to the Emergency Department if any problems should occur.  Please show the CHEMOTHERAPY ALERT CARD or  IMMUNOTHERAPY ALERT CARD at check-in to the Emergency Department and triage nurse.  Should you have questions after your visit or need to cancel or reschedule your appointment, please contact [facility] - A DEPT [facility]  Dept: [phone]  and follow the prompts.  Office hours are 8:00 a.m. to 4:30 p.m. Monday - Friday. Please note that voicemails left after 4:00 p.m. may not be returned until the following business day.  We are closed weekends and major holidays. You have access to a nurse at all times for urgent questions. Please call the main number to the clinic Dept: [phone] and follow the prompts.   For any non-urgent questions, you may also contact your provider using MyChart. We now offer e-Visits for anyone 62 and older to request care online for non-urgent symptoms. For details visit [email or web address].   Also download the MyChart app! Go to the app store, search MyChart, open the app, select [facility], and log in with your MyChart username and password.  Pembrolizumab  Injection What is this medication? PEMBROLIZUMAB  (PEM broe LIZ ue mab) treats some types of cancer. It works by helping your immune system slow or stop the spread of cancer cells. It is a monoclonal antibody. This medicine may be used for other purposes; ask your health care provider or pharmacist if you have questions. COMMON BRAND NAME(S): Keytruda  What should I tell my care team before I take this medication? They need to know if you have any of these conditions: Allogeneic stem cell transplant (uses someone else's stem  cells) Autoimmune diseases, such as Crohn disease, ulcerative colitis, lupus History of chest radiation Nervous system problems, such as Guillain-Barre syndrome, myasthenia gravis Organ transplant An unusual or allergic reaction to pembrolizumab , other medications, foods, dyes, or preservatives Pregnant or trying to get  pregnant Breast-feeding How should I use this medication? This medication is injected into a vein. It is given by your care team in a hospital or clinic setting. A special MedGuide will be given to you before each treatment. Be sure to read this information carefully each time. Talk to your care team about the use of this medication in children. While it may be prescribed for children as young as 6 months for selected conditions, precautions do apply. Overdosage: If you think you have taken too much of this medicine contact a poison control center or emergency room at once. NOTE: This medicine is only for you. Do not share this medicine with others. What if I miss a dose? Keep appointments for follow-up doses. It is important not to miss your dose. Call your care team if you are unable to keep an appointment. What may interact with this medication? Interactions have not been studied. This list may not describe all possible interactions. Give your health care provider a list of all the medicines, herbs, non-prescription drugs, or dietary supplements you use. Also tell them if you smoke, drink alcohol, or use illegal drugs. Some items may interact with your medicine. What should I watch for while using this medication? Your condition will be monitored carefully while you are receiving this medication. You may need blood work while taking this medication. This medication may cause serious skin reactions. They can happen weeks to months after starting the medication. Contact your care team right away if you notice fevers or flu-like symptoms with a rash. The rash may be red or purple and then turn into blisters or peeling of the skin. You may also notice a red rash with swelling of the face, lips, or lymph nodes in your neck or under your arms. Tell your care team right away if you have any change in your eyesight. Talk to your care team if you may be pregnant. Serious birth defects can occur if you  take this medication during pregnancy and for 4 months after the last dose. You will need a negative pregnancy test before starting this medication. Contraception is recommended while taking this medication and for 4 months after the last dose. Your care team can help you find the option that works for you. Do not breastfeed while taking this medication and for 4 months after the last dose. What side effects may I notice from receiving this medication? Side effects that you should report to your care team as soon as possible: Allergic reactions--skin rash, itching, hives, swelling of the face, lips, tongue, or throat Dry cough, shortness of breath or trouble breathing Eye pain, redness, irritation, or discharge with blurry or decreased vision Heart muscle inflammation--unusual weakness or fatigue, shortness of breath, chest pain, fast or irregular heartbeat, dizziness, swelling of the ankles, feet, or hands Hormone gland problems--headache, sensitivity to light, unusual weakness or fatigue, dizziness, fast or irregular heartbeat, increased sensitivity to cold or heat, excessive sweating, constipation, hair loss, increased thirst or amount of urine, tremors or shaking, irritability Infusion reactions--chest pain, shortness of breath or trouble breathing, feeling faint or lightheaded Kidney injury (glomerulonephritis)--decrease in the amount of urine, red or dark brown urine, foamy or bubbly urine, swelling of the ankles, hands, or  feet Liver injury--right upper belly pain, loss of appetite, nausea, light-colored stool, dark yellow or brown urine, yellowing skin or eyes, unusual weakness or fatigue Pain, tingling, or numbness in the hands or feet, muscle weakness, change in vision, confusion or trouble speaking, loss of balance or coordination, trouble walking, seizures Rash, fever, and swollen lymph nodes Redness, blistering, peeling, or loosening of the skin, including inside the mouth Sudden or  severe stomach pain, bloody diarrhea, fever, nausea, vomiting Side effects that usually do not require medical attention (report to your care team if they continue or are bothersome): Bone, joint, or muscle pain Diarrhea Fatigue Loss of appetite Nausea Skin rash This list may not describe all possible side effects. Call your doctor for medical advice about side effects. You may report side effects to FDA at 1-800-FDA-1088. Where should I keep my medication? This medication is given in a hospital or clinic. It will not be stored at home. NOTE: This sheet is a summary. It may not cover all possible information. If you have questions about this medicine, talk to your doctor, pharmacist, or health care provider.  2024 Elsevier/Gold Standard (2021-06-14 00:00:00)

## 2023-03-20 NOTE — Assessment & Plan Note (Signed)
Was on prednisone 5 mg daily and increased to 10 mg daily with elbows, shoulder pain and subsided after increased. Refilled 10 mg daily today.

## 2023-03-21 NOTE — Telephone Encounter (Signed)
-----   Message from Nurse Nellie Banas A sent at 03/20/2023  2:39 PM EST ----- Regarding: First timer 03/20/23- First time keytruda - Dr. Alita Irwin. Tolerated well. Does take oral chemo

## 2023-03-21 NOTE — Telephone Encounter (Signed)
Called & left message for pt to return call to Dr Nelta Numbers RN to let us know how he did with his treatment.

## 2023-03-22 ENCOUNTER — Other Ambulatory Visit: Payer: Self-pay

## 2023-03-25 ENCOUNTER — Other Ambulatory Visit: Payer: Self-pay

## 2023-03-26 ENCOUNTER — Other Ambulatory Visit: Payer: Self-pay

## 2023-03-26 ENCOUNTER — Other Ambulatory Visit (HOSPITAL_COMMUNITY): Payer: Self-pay

## 2023-03-27 ENCOUNTER — Other Ambulatory Visit (HOSPITAL_COMMUNITY): Payer: Self-pay

## 2023-03-27 ENCOUNTER — Telehealth: Payer: Self-pay

## 2023-03-27 ENCOUNTER — Other Ambulatory Visit: Payer: Self-pay

## 2023-03-27 DIAGNOSIS — C641 Malignant neoplasm of right kidney, except renal pelvis: Secondary | ICD-10-CM

## 2023-03-27 MED ORDER — LENVATINIB (10 MG DAILY DOSE) 10 MG PO CPPK
10.0000 mg | ORAL_CAPSULE | Freq: Every day | ORAL | 11 refills | Status: DC
  Filled 2023-03-27: qty 30, 30d supply, fill #0

## 2023-03-27 MED ORDER — LENVATINIB (10 MG DAILY DOSE) 10 MG PO CPPK: 10.0000 mg | ORAL_CAPSULE | Freq: Every day | ORAL | 11 refills | Status: DC

## 2023-03-27 NOTE — Progress Notes (Signed)
Specialty Pharmacy Refill Coordination Note  MURIEL WILBER is a 73 y.o. male contacted today regarding refills of specialty medication(s) Lenvatinib Mesylate [facility])   Patient requested Delivery   Delivery date: 04/05/23   Verified address: [address] [place] [address]   Medication will be filled on 04/04/23.

## 2023-03-27 NOTE — Telephone Encounter (Signed)
Oral Oncology Pharmacist Encounter  Spoke with MD and he would like patient to continue on lenvima 10mg  at this time. MD entered new prescription of 10mg  and the 20mg  prescription was discontinued. Prescription accidentally released to [facility] so prescription was re-entered to be directed to [facility] specialty pharmacy for filling. Pharmacist at specialty pharmacy called [facility] to get prescription cancelled from other pharmacy.   Bethel Born, PharmD Hematology/Oncology Clinical Pharmacist [facility] [phone]

## 2023-03-27 NOTE — Progress Notes (Signed)
Refilled lenvatinib at 10 mg daily

## 2023-03-27 NOTE — Progress Notes (Signed)
Specialty Pharmacy Ongoing Clinical Assessment Note  Jay Chapman is a 73 y.o. male who is being followed by the specialty pharmacy service for RxSp Oncology   Patient's specialty medication(s) reviewed today: Lenvatinib Mesylate (LENVIMA)   Missed doses in the last 4 weeks: 0   Patient/Caregiver did not have any additional questions or concerns.   Therapeutic benefit summary: Unable to assess (pt is new to therapy)   Adverse events/side effects summary: No adverse events/side effects   Patient's therapy is appropriate to: Continue    Goals Addressed             This Visit's Progress    Maintain optimal adherence to therapy   No change    Patient is initiating therapy. Patient will maintain adherence         Follow up:  3 months  [name] Specialty Pharmacist

## 2023-04-02 ENCOUNTER — Other Ambulatory Visit: Payer: Self-pay

## 2023-04-02 NOTE — Progress Notes (Signed)
Patient was contacted via mychart that due to possible impending winter storm, medication will arrive on Tuesday 04/03/23.

## 2023-04-08 ENCOUNTER — Other Ambulatory Visit: Payer: Self-pay

## 2023-04-09 NOTE — Progress Notes (Unsigned)
 Patient Care Team: [name], MD as PCP - General  Clinic Day:  04/10/2023  Referring physician: [name], MD  ASSESSMENT & PLAN:   Assessment & Plan: Jay Chapman is a 73 y.o.male with history of hypertension, HLD being seen at Medical Oncology Clinic for [facility].   Diagnosis: Right RCC. Clear cell, G3. [id number] with lung metastases, 9.9 x 16.2 x 16.4 cm R renal mass and IVC thrombus.   IMDC risk: Intermediate-risk group with 2 prognostic factors  Prognostic Factors  Yes: Less than one year from time of diagnosis to systemic therapy  No: Performance status <80% ([place]sky)  No: Hemoglobin < lower limit of normal (Normal: 120 g/L or 12 g/dL)  No: Calcium > upper limit of normal (Normal: 8.5-10.2 mg/dL)  No: Neutrophil > upper limit of normal (Normal: 2.0-7.010?/L)  No: Platelets > upper limit of normal (Normal: 150,000-400,000)   Treatment: 03/08/23 started Lenvatinib 10 mg 03/20/23 started pembrolizumab   Overall he tolerated it well.  Inflammatory arthritis appeared to flare after starting treatment and he self increase prednisone to 10 mg daily with relief.  He has been on prednisone reported for years and tolerating well.  He has arthritis worsening over the last few weeks.  Currently his inflammatory arthritis is not controlled therefore we will hold Keytruda on this week.  His mass appeared to be smaller.  Will start prednisone taper.  Discussed GI prophylaxis to prevent stress ulcer.  Renal cell carcinoma of right kidney (HCC) Continue lenvatinib  Hold pembro Monitor with lab and exam closely for toxicities.  Inflammatory arthritis Worsening arthritis after immunotherapy.  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   Essential  hypertension BP about 140's at home  Continue amlodipine, benazepril    The patient understands the plans discussed today and is in agreement with them.  He knows to contact our office if he develops concerns prior to his next appointment.  Melven Sartorius, MD  [facility] - A DEPT [facility] [address] [place] [address] Dept: [phone] Dept Fax: [phone]   CHIEF COMPLAINT:  CC: Metastatic RCC  Current Treatment: Lenvatinib pembrolizumab  INTERVAL HISTORY:  Jay Chapman is here today for repeat clinical assessment.  Report worsening joint pain in the knees, hips since last visit. In the past prednisone helped.  No rash, coughing, short of breath, diarrhea.  Report hoarseness. Swallowing ok.  I have reviewed the past medical history, past surgical history, social history and family history with the patient and they are unchanged from previous note.  ALLERGIES:  is allergic to doxycycline monohydrate.  MEDICATIONS:  Current Outpatient Medications  Medication Sig Dispense Refill   amLODipine (NORVASC) 5 MG tablet TAKE 1 TABLET EVERY DAY 90 tablet 3   apixaban (ELIQUIS) 5 MG TABS tablet Take 1 tablet (5 mg total) by mouth 2 (two) times daily. 60 tablet 3   benazepril (LOTENSIN) 40 MG tablet TAKE 1 TABLET EVERY DAY 90 tablet 3   Cholecalciferol 50 MCG (2000 UT) TABS 1 tab by mouth once daily 30 tablet 99   lenvatinib 10 mg daily dose (LENVIMA) capsule Take  1 capsule (10 mg total) by mouth daily. 30 capsule 11   levothyroxine (SYNTHROID) 112 MCG tablet TAKE 1 TABLET EVERY DAY 90 tablet 3   metroNIDAZOLE (METROCREAM) 0.75 % cream Apply topically 2 (two) times daily. 45 g 0   omeprazole (PRILOSEC) 20 MG capsule Take 1 capsule (20 mg total) by mouth daily. 90 capsule 1   ondansetron (ZOFRAN) 8 MG tablet Take 8 mg by mouth every 8 (eight) hours as needed for nausea or vomiting.     ondansetron (ZOFRAN) 8 MG  tablet Take 1 tablet (8 mg total) by mouth every 8 (eight) hours as needed for nausea or vomiting. 20 tablet 0   predniSONE (DELTASONE) 10 MG tablet Start at 60 mg daily x 1 weeks, then 40 mg daily x 1 weeks, then 30 mg daily x 1 week, then 20 mg daily x 1 week, then 10 mg daily 112 tablet 0   prochlorperazine (COMPAZINE) 10 MG tablet Take 1 tablet (10 mg total) by mouth every 6 (six) hours as needed for nausea or vomiting. 30 tablet 1   No current facility-administered medications for this visit.    HISTORY OF PRESENT ILLNESS:   Oncology History  Renal cell carcinoma of right kidney (HCC)  01/24/2023 Imaging   CT AP 20 cm right renal mass, consistent with renal cell carcinoma. This mass abuts the right lateral abdominal wall muscles, suspicious for invasion through Gerota's fascia.   Tumor thrombus within right renal vein and infrahepatic IVC.   No evidence of abdominal lymphadenopathy or pelvic metastatic disease.   Colonic diverticulosis, without radiographic evidence of diverticulitis.   02/12/2023 Imaging   CT chest Multiple bilateral pulm nodules largest 10 mm in the posterior left lower lobe.  Imaging features concerning for metastatic disease. 4.6 cm ascending thoracic aortic aneurysm.  Rec semiannual imaging follow-up Exophytic right renal mass with evidence of tumor thrombus in the IVC   02/21/2023 Imaging   MRI ABD IMPRESSION: 1. Huge solid and partially necrotic mass emanating from the lateral aspect of the right kidney measuring up to approximately 19.9 x 16.2 x 16.4 cm and consistent with renal carcinoma. Enhancing portions of the mass are again noted to be quite vascular with prominent early enhancement and large capsular and central draining veins. 2. The main right renal vein is distended with enhancing tumor thrombus as seen by CT. Enhancing tumor thrombus extends into the IVC and ascends up towards the liver nearly to the intrahepatic IVC. Length of IVC  tumor thrombus is approximately 5 cm and the thrombus occupies up to approximately 50% of the IVC lumen. 3. There appears to be a single right renal artery without visualized accessory right-sided renal supply. 4. There is very little normal enhancing renal parenchyma on the right along the anterior aspect of the lower right kidney. Tumor extends to abut the lateral and anterolateral abdominal wall.     02/27/2023 Pathology Results   RIGHT RENAL MASS BIOPSY  A. KIDNEY, RIGHT MASS, NEEDLE CORE BIOPSY:  Renal cell carcinoma, clear-cell type, WHO grade 3.  See comment.    03/02/2023 Initial Diagnosis   Renal cell carcinoma of right kidney (HCC)   03/14/2023 Imaging   MRI brain: No acute intracranial process. No evidence of intracranial metastatic disease.   03/20/2023 -  Chemotherapy   Patient is on Treatment Plan : UTERINE Lenvatinib (20) D1-21 + Pembrolizumab (200) D1 q21d         REVIEW OF SYSTEMS:   All relevant systems were reviewed  with the patient and are negative.   VITALS:  Blood pressure (!) 147/84, pulse 83, temperature 98.8 F (37.1 C), temperature source Temporal, resp. rate 17, weight 215 lb 3.2 oz (97.6 kg), SpO2 99%.  Wt Readings from Last 3 Encounters:  04/10/23 215 lb 3.2 oz (97.6 kg)  03/20/23 212 lb 11.2 oz (96.5 kg)  03/05/23 214 lb 4.8 oz (97.2 kg)    Body mass index is 28.39 kg/m.  Performance status (ECOG): 1 - Symptomatic but completely ambulatory  PHYSICAL EXAM:   GENERAL:alert, no distress and comfortable SKIN: skin color normal, no rashes  EYES: normal, sclera clear LUNGS: clear to auscultation with normal breathing effort.  No wheeze or rales HEART: regular rate & rhythm and no murmurs  ABDOMEN: abdomen soft, non-tender and nondistended. Right side mass about 16 cm x 13 cm Musculoskeletal: no edema  LABORATORY DATA:  I have reviewed the data as listed    Component Value Date/Time   NA 137 04/10/2023 0900   K 4.1 04/10/2023 0900   CL 106  04/10/2023 0900   CO2 25 04/10/2023 0900   GLUCOSE 108 (H) 04/10/2023 0900   BUN 24 (H) 04/10/2023 0900   CREATININE 1.07 04/10/2023 0900   CALCIUM 8.8 (L) 04/10/2023 0900   PROT 6.1 (L) 04/10/2023 0900   ALBUMIN 3.2 (L) 04/10/2023 0900   AST 67 (H) 04/10/2023 0900   ALT 119 (H) 04/10/2023 0900   ALKPHOS 305 (H) 04/10/2023 0900   BILITOT 1.9 (H) 04/10/2023 0900   GFRNONAA >60 04/10/2023 0900   GFRAA 88 10/22/2007 0857    No results found for: "SPEP", "UPEP"  Lab Results  Component Value Date   WBC 6.0 04/10/2023   NEUTROABS 4.7 04/10/2023   HGB 11.3 (L) 04/10/2023   HCT 35.3 (L) 04/10/2023   MCV 88.7 04/10/2023   PLT 175 04/10/2023      Chemistry      Component Value Date/Time   NA 137 04/10/2023 0900   K 4.1 04/10/2023 0900   CL 106 04/10/2023 0900   CO2 25 04/10/2023 0900   BUN 24 (H) 04/10/2023 0900   CREATININE 1.07 04/10/2023 0900      Component Value Date/Time   CALCIUM 8.8 (L) 04/10/2023 0900   ALKPHOS 305 (H) 04/10/2023 0900   AST 67 (H) 04/10/2023 0900   ALT 119 (H) 04/10/2023 0900   BILITOT 1.9 (H) 04/10/2023 0900       RADIOGRAPHIC STUDIES: I have personally reviewed the radiological images as listed and agreed with the findings in the report. MR Brain W Wo Contrast Result Date: 03/19/2023 CLINICAL DATA:  Kidney cancer, staging EXAM: MRI HEAD WITHOUT AND WITH CONTRAST TECHNIQUE: Multiplanar, multiecho pulse sequences of the brain and surrounding structures were obtained without and with intravenous contrast. CONTRAST:  10mL GADAVIST GADOBUTROL 1 MMOL/ML IV SOLN COMPARISON:  None Available. FINDINGS: Brain: No restricted diffusion to suggest acute or subacute infarct. No abnormal parenchymal or meningeal enhancement. No acute hemorrhage, mass, mass effect, or midline shift. No hydrocephalus or extra-axial collection. Pituitary and craniocervical junction within normal limits. No hemosiderin deposition to suggest remote hemorrhage. Advanced cerebral atrophy  for age. No disproportionate lobar atrophy Vascular: Normal arterial flow voids. Normal arterial and venous enhancement. Skull and upper cervical spine: Normal marrow signal. Sinuses/Orbits: Clear paranasal sinuses. No acute finding in the orbits. IMPRESSION: No acute intracranial process. No evidence of intracranial metastatic disease. Electronically Signed   By: Wiliam Ke M.D.   On: 03/19/2023 15:24

## 2023-04-09 NOTE — Assessment & Plan Note (Signed)
 Continue lenvatinib  Plan to add pembro Monitor with lab and exam closely for toxicities.

## 2023-04-09 NOTE — Assessment & Plan Note (Signed)
 on prednisone 10 mg daily with elbows, shoulder pain and subsided after increased. Refilled 10 mg daily today.

## 2023-04-10 ENCOUNTER — Inpatient Hospital Stay (HOSPITAL_BASED_OUTPATIENT_CLINIC_OR_DEPARTMENT_OTHER): Payer: HMO

## 2023-04-10 ENCOUNTER — Encounter: Payer: Self-pay | Admitting: Internal Medicine

## 2023-04-10 ENCOUNTER — Other Ambulatory Visit: Payer: Self-pay

## 2023-04-10 ENCOUNTER — Inpatient Hospital Stay: Payer: HMO

## 2023-04-10 ENCOUNTER — Telehealth: Payer: Self-pay

## 2023-04-10 ENCOUNTER — Other Ambulatory Visit (HOSPITAL_COMMUNITY): Payer: Self-pay

## 2023-04-10 VITALS — BP 147/84 | HR 83 | Temp 98.8°F | Resp 17 | Wt 215.2 lb

## 2023-04-10 DIAGNOSIS — I10 Essential (primary) hypertension: Secondary | ICD-10-CM

## 2023-04-10 DIAGNOSIS — D499 Neoplasm of unspecified behavior of unspecified site: Secondary | ICD-10-CM

## 2023-04-10 DIAGNOSIS — C641 Malignant neoplasm of right kidney, except renal pelvis: Secondary | ICD-10-CM | POA: Diagnosis not present

## 2023-04-10 DIAGNOSIS — I82220 Acute embolism and thrombosis of inferior vena cava: Secondary | ICD-10-CM

## 2023-04-10 DIAGNOSIS — Z5112 Encounter for antineoplastic immunotherapy: Secondary | ICD-10-CM | POA: Diagnosis not present

## 2023-04-10 DIAGNOSIS — M1990 Unspecified osteoarthritis, unspecified site: Secondary | ICD-10-CM

## 2023-04-10 LAB — CBC WITH DIFFERENTIAL (CANCER CENTER ONLY)
Abs Immature Granulocytes: 0.04 10*3/uL (ref 0.00–0.07)
Basophils Absolute: 0 10*3/uL (ref 0.0–0.1)
Basophils Relative: 0 %
Eosinophils Absolute: 0 10*3/uL (ref 0.0–0.5)
Eosinophils Relative: 1 %
HCT: 35.3 % — ABNORMAL LOW (ref 39.0–52.0)
Hemoglobin: 11.3 g/dL — ABNORMAL LOW (ref 13.0–17.0)
Immature Granulocytes: 1 %
Lymphocytes Relative: 9 %
Lymphs Abs: 0.6 10*3/uL — ABNORMAL LOW (ref 0.7–4.0)
MCH: 28.4 pg (ref 26.0–34.0)
MCHC: 32 g/dL (ref 30.0–36.0)
MCV: 88.7 fL (ref 80.0–100.0)
Monocytes Absolute: 0.6 10*3/uL (ref 0.1–1.0)
Monocytes Relative: 11 %
Neutro Abs: 4.7 10*3/uL (ref 1.7–7.7)
Neutrophils Relative %: 78 %
Platelet Count: 175 10*3/uL (ref 150–400)
RBC: 3.98 MIL/uL — ABNORMAL LOW (ref 4.22–5.81)
RDW: 15.8 % — ABNORMAL HIGH (ref 11.5–15.5)
WBC Count: 6 10*3/uL (ref 4.0–10.5)
nRBC: 0 % (ref 0.0–0.2)

## 2023-04-10 LAB — CMP (CANCER CENTER ONLY)
ALT: 119 U/L — ABNORMAL HIGH (ref 0–44)
AST: 67 U/L — ABNORMAL HIGH (ref 15–41)
Albumin: 3.2 g/dL — ABNORMAL LOW (ref 3.5–5.0)
Alkaline Phosphatase: 305 U/L — ABNORMAL HIGH (ref 38–126)
Anion gap: 6 (ref 5–15)
BUN: 24 mg/dL — ABNORMAL HIGH (ref 8–23)
CO2: 25 mmol/L (ref 22–32)
Calcium: 8.8 mg/dL — ABNORMAL LOW (ref 8.9–10.3)
Chloride: 106 mmol/L (ref 98–111)
Creatinine: 1.07 mg/dL (ref 0.61–1.24)
GFR, Estimated: >60 mL/min (ref >60)
Glucose, Bld: 108 mg/dL — ABNORMAL HIGH (ref 70–99)
Potassium: 4.1 mmol/L (ref 3.5–5.1)
Sodium: 137 mmol/L (ref 135–145)
Total Bilirubin: 1.9 mg/dL — ABNORMAL HIGH (ref 0.0–1.2)
Total Protein: 6.1 g/dL — ABNORMAL LOW (ref 6.5–8.1)

## 2023-04-10 LAB — C-REACTIVE PROTEIN: CRP: 9.8 mg/dL — ABNORMAL HIGH (ref <1.0)

## 2023-04-10 LAB — LACTATE DEHYDROGENASE: LDH: 364 U/L — ABNORMAL HIGH (ref 98–192)

## 2023-04-10 MED ORDER — PREDNISONE 10 MG PO TABS
ORAL_TABLET | ORAL | 0 refills | Status: AC
  Filled 2023-04-10 – 2023-04-15 (×2): qty 112, 35d supply, fill #0

## 2023-04-10 NOTE — Telephone Encounter (Signed)
 We had thought at the  last visit that 3 mo would be important given his condition then, but if he is doing better, I would say f/u at 6 months would be ok instead of 3 months.   Ok to reschedule if he is doing ok especially with his gastrointestinal problems last visit    thanks

## 2023-04-10 NOTE — Telephone Encounter (Signed)
-----   Message from Melven Sartorius sent at 04/10/2023 12:26 PM EST ----- Would you ask him to call us about 2 weeks to see if he feels better on prednisone for his arthritis flare from immunotherapy? Maybe a day you are here since you are aware of this today and let me know. If feeling a lot better, I would like to request higher dose of lenvatinib for cycle 3 since we have to hold immunotherapy before he comes back. Thanks.

## 2023-04-10 NOTE — Assessment & Plan Note (Signed)
 BP about 140's at home  Continue amlodipine, benazepril

## 2023-04-10 NOTE — Patient Instructions (Signed)
 Renal cell carcinoma of right kidney (HCC) Continue lenvatinib  Hold pembrolizumab (Keytruda) Monitor with lab and exam closely for toxicities.  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   Essential hypertension BP about 140's at home  Continue amlodipine, benazepril

## 2023-04-10 NOTE — Assessment & Plan Note (Signed)
 Continue apixaban 5 mg twice daily

## 2023-04-10 NOTE — Telephone Encounter (Signed)
 TC to inform of the below message by Dr. Cherly Hensen. Pt verbalizes understanding and states he will call office w/ an update about his arthritis on 3/10 or 3/11.

## 2023-04-16 ENCOUNTER — Other Ambulatory Visit: Payer: Self-pay

## 2023-04-16 ENCOUNTER — Other Ambulatory Visit (HOSPITAL_COMMUNITY): Payer: Self-pay

## 2023-04-17 ENCOUNTER — Other Ambulatory Visit: Payer: Self-pay

## 2023-04-19 ENCOUNTER — Other Ambulatory Visit: Payer: Self-pay

## 2023-04-20 ENCOUNTER — Other Ambulatory Visit: Payer: Self-pay

## 2023-04-20 ENCOUNTER — Inpatient Hospital Stay (HOSPITAL_COMMUNITY)

## 2023-04-20 ENCOUNTER — Emergency Department (HOSPITAL_COMMUNITY)

## 2023-04-20 ENCOUNTER — Inpatient Hospital Stay (HOSPITAL_COMMUNITY)
Admission: EM | Admit: 2023-04-20 | Discharge: 2023-04-23 | DRG: 871 | Disposition: A | Discharge status: Hospice | Attending: Internal Medicine | Admitting: Internal Medicine

## 2023-04-20 ENCOUNTER — Encounter (HOSPITAL_COMMUNITY): Payer: Self-pay

## 2023-04-20 DIAGNOSIS — E860 Dehydration: Secondary | ICD-10-CM | POA: Diagnosis present

## 2023-04-20 DIAGNOSIS — R000 Tachycardia, unspecified: Secondary | ICD-10-CM | POA: Diagnosis not present

## 2023-04-20 DIAGNOSIS — E869 Volume depletion, unspecified: Secondary | ICD-10-CM | POA: Diagnosis not present

## 2023-04-20 DIAGNOSIS — E785 Hyperlipidemia, unspecified: Secondary | ICD-10-CM | POA: Diagnosis not present

## 2023-04-20 DIAGNOSIS — D72829 Elevated white blood cell count, unspecified: Secondary | ICD-10-CM

## 2023-04-20 DIAGNOSIS — Z7901 Long term (current) use of anticoagulants: Secondary | ICD-10-CM

## 2023-04-20 DIAGNOSIS — N1831 Chronic kidney disease, stage 3a: Secondary | ICD-10-CM | POA: Diagnosis present

## 2023-04-20 DIAGNOSIS — I639 Cerebral infarction, unspecified: Secondary | ICD-10-CM | POA: Diagnosis not present

## 2023-04-20 DIAGNOSIS — I10 Essential (primary) hypertension: Secondary | ICD-10-CM | POA: Diagnosis present

## 2023-04-20 DIAGNOSIS — D62 Acute posthemorrhagic anemia: Secondary | ICD-10-CM | POA: Diagnosis present

## 2023-04-20 DIAGNOSIS — M064 Inflammatory polyarthropathy: Secondary | ICD-10-CM | POA: Diagnosis present

## 2023-04-20 DIAGNOSIS — Z881 Allergy status to other antibiotic agents status: Secondary | ICD-10-CM

## 2023-04-20 DIAGNOSIS — Z515 Encounter for palliative care: Secondary | ICD-10-CM

## 2023-04-20 DIAGNOSIS — Z7189 Other specified counseling: Secondary | ICD-10-CM | POA: Diagnosis not present

## 2023-04-20 DIAGNOSIS — D631 Anemia in chronic kidney disease: Secondary | ICD-10-CM | POA: Diagnosis present

## 2023-04-20 DIAGNOSIS — K7689 Other specified diseases of liver: Secondary | ICD-10-CM | POA: Diagnosis not present

## 2023-04-20 DIAGNOSIS — I2489 Other forms of acute ischemic heart disease: Secondary | ICD-10-CM | POA: Diagnosis not present

## 2023-04-20 DIAGNOSIS — L719 Rosacea, unspecified: Secondary | ICD-10-CM | POA: Diagnosis present

## 2023-04-20 DIAGNOSIS — R7989 Other specified abnormal findings of blood chemistry: Secondary | ICD-10-CM

## 2023-04-20 DIAGNOSIS — A419 Sepsis, unspecified organism: Secondary | ICD-10-CM | POA: Diagnosis not present

## 2023-04-20 DIAGNOSIS — D499 Neoplasm of unspecified behavior of unspecified site: Secondary | ICD-10-CM | POA: Diagnosis present

## 2023-04-20 DIAGNOSIS — W228XXA Striking against or struck by other objects, initial encounter: Secondary | ICD-10-CM | POA: Diagnosis present

## 2023-04-20 DIAGNOSIS — E876 Hypokalemia: Secondary | ICD-10-CM | POA: Diagnosis present

## 2023-04-20 DIAGNOSIS — R918 Other nonspecific abnormal finding of lung field: Secondary | ICD-10-CM | POA: Diagnosis not present

## 2023-04-20 DIAGNOSIS — R29810 Facial weakness: Secondary | ICD-10-CM | POA: Diagnosis present

## 2023-04-20 DIAGNOSIS — C641 Malignant neoplasm of right kidney, except renal pelvis: Secondary | ICD-10-CM | POA: Diagnosis not present

## 2023-04-20 DIAGNOSIS — E861 Hypovolemia: Secondary | ICD-10-CM | POA: Diagnosis present

## 2023-04-20 DIAGNOSIS — I672 Cerebral atherosclerosis: Secondary | ICD-10-CM | POA: Diagnosis not present

## 2023-04-20 DIAGNOSIS — E872 Acidosis, unspecified: Secondary | ICD-10-CM | POA: Diagnosis not present

## 2023-04-20 DIAGNOSIS — Z79890 Hormone replacement therapy (postmenopausal): Secondary | ICD-10-CM

## 2023-04-20 DIAGNOSIS — R9082 White matter disease, unspecified: Secondary | ICD-10-CM | POA: Diagnosis not present

## 2023-04-20 DIAGNOSIS — I6523 Occlusion and stenosis of bilateral carotid arteries: Secondary | ICD-10-CM | POA: Diagnosis not present

## 2023-04-20 DIAGNOSIS — R29818 Other symptoms and signs involving the nervous system: Secondary | ICD-10-CM

## 2023-04-20 DIAGNOSIS — G459 Transient cerebral ischemic attack, unspecified: Secondary | ICD-10-CM | POA: Diagnosis not present

## 2023-04-20 DIAGNOSIS — N400 Enlarged prostate without lower urinary tract symptoms: Secondary | ICD-10-CM | POA: Diagnosis not present

## 2023-04-20 DIAGNOSIS — S41119A Laceration without foreign body of unspecified upper arm, initial encounter: Secondary | ICD-10-CM | POA: Diagnosis present

## 2023-04-20 DIAGNOSIS — D649 Anemia, unspecified: Secondary | ICD-10-CM

## 2023-04-20 DIAGNOSIS — R6520 Severe sepsis without septic shock: Secondary | ICD-10-CM

## 2023-04-20 DIAGNOSIS — N179 Acute kidney failure, unspecified: Secondary | ICD-10-CM | POA: Diagnosis not present

## 2023-04-20 DIAGNOSIS — G893 Neoplasm related pain (acute) (chronic): Secondary | ICD-10-CM | POA: Diagnosis not present

## 2023-04-20 DIAGNOSIS — I82220 Acute embolism and thrombosis of inferior vena cava: Secondary | ICD-10-CM | POA: Diagnosis present

## 2023-04-20 DIAGNOSIS — R55 Syncope and collapse: Secondary | ICD-10-CM | POA: Diagnosis not present

## 2023-04-20 DIAGNOSIS — Z66 Do not resuscitate: Secondary | ICD-10-CM | POA: Diagnosis not present

## 2023-04-20 DIAGNOSIS — E039 Hypothyroidism, unspecified: Secondary | ICD-10-CM | POA: Diagnosis present

## 2023-04-20 DIAGNOSIS — I129 Hypertensive chronic kidney disease with stage 1 through stage 4 chronic kidney disease, or unspecified chronic kidney disease: Secondary | ICD-10-CM | POA: Diagnosis not present

## 2023-04-20 DIAGNOSIS — Z79899 Other long term (current) drug therapy: Secondary | ICD-10-CM

## 2023-04-20 DIAGNOSIS — Z860100 Personal history of colon polyps, unspecified: Secondary | ICD-10-CM

## 2023-04-20 DIAGNOSIS — G4733 Obstructive sleep apnea (adult) (pediatric): Secondary | ICD-10-CM | POA: Diagnosis present

## 2023-04-20 DIAGNOSIS — M1990 Unspecified osteoarthritis, unspecified site: Secondary | ICD-10-CM | POA: Diagnosis present

## 2023-04-20 DIAGNOSIS — N2889 Other specified disorders of kidney and ureter: Secondary | ICD-10-CM | POA: Diagnosis not present

## 2023-04-20 DIAGNOSIS — R4701 Aphasia: Secondary | ICD-10-CM | POA: Diagnosis not present

## 2023-04-20 DIAGNOSIS — I959 Hypotension, unspecified: Secondary | ICD-10-CM | POA: Diagnosis not present

## 2023-04-20 DIAGNOSIS — K5730 Diverticulosis of large intestine without perforation or abscess without bleeding: Secondary | ICD-10-CM | POA: Diagnosis not present

## 2023-04-20 DIAGNOSIS — C7800 Secondary malignant neoplasm of unspecified lung: Secondary | ICD-10-CM | POA: Diagnosis not present

## 2023-04-20 DIAGNOSIS — R531 Weakness: Secondary | ICD-10-CM | POA: Diagnosis not present

## 2023-04-20 DIAGNOSIS — Z83719 Family history of colon polyps, unspecified: Secondary | ICD-10-CM

## 2023-04-20 DIAGNOSIS — I6502 Occlusion and stenosis of left vertebral artery: Secondary | ICD-10-CM | POA: Diagnosis not present

## 2023-04-20 DIAGNOSIS — N281 Cyst of kidney, acquired: Secondary | ICD-10-CM | POA: Diagnosis not present

## 2023-04-20 DIAGNOSIS — I9589 Other hypotension: Secondary | ICD-10-CM | POA: Diagnosis present

## 2023-04-20 LAB — COMPREHENSIVE METABOLIC PANEL
ALT: 81 U/L — ABNORMAL HIGH (ref 0–44)
AST: 47 U/L — ABNORMAL HIGH (ref 15–41)
Albumin: 2.4 g/dL — ABNORMAL LOW (ref 3.5–5.0)
Alkaline Phosphatase: 211 U/L — ABNORMAL HIGH (ref 38–126)
Anion gap: 16 — ABNORMAL HIGH (ref 5–15)
BUN: 35 mg/dL — ABNORMAL HIGH (ref 8–23)
CO2: 19 mmol/L — ABNORMAL LOW (ref 22–32)
Calcium: 8.1 mg/dL — ABNORMAL LOW (ref 8.9–10.3)
Chloride: 102 mmol/L (ref 98–111)
Creatinine, Ser: 2.03 mg/dL — ABNORMAL HIGH (ref 0.61–1.24)
GFR, Estimated: 34 mL/min — ABNORMAL LOW (ref >60)
Glucose, Bld: 188 mg/dL — ABNORMAL HIGH (ref 70–99)
Potassium: 4.6 mmol/L (ref 3.5–5.1)
Sodium: 137 mmol/L (ref 135–145)
Total Bilirubin: 1.9 mg/dL — ABNORMAL HIGH (ref 0.0–1.2)
Total Protein: 5.3 g/dL — ABNORMAL LOW (ref 6.5–8.1)

## 2023-04-20 LAB — I-STAT CG4 LACTIC ACID, ED
Lactic Acid, Venous: 8.4 mmol/L (ref 0.5–1.9)
Lactic Acid, Venous: 6.1 mmol/L (ref 0.5–1.9)
Lactic Acid, Venous: 3.9 mmol/L (ref 0.5–1.9)
Lactic Acid, Venous: 2.1 mmol/L (ref 0.5–1.9)

## 2023-04-20 LAB — URINALYSIS, ROUTINE W REFLEX MICROSCOPIC
Bilirubin Urine: NEGATIVE
Glucose, UA: NEGATIVE mg/dL
Ketones, ur: NEGATIVE mg/dL
Leukocytes,Ua: NEGATIVE
Nitrite: NEGATIVE
Protein, ur: 30 mg/dL — AB
Specific Gravity, Urine: 1.041 — ABNORMAL HIGH (ref 1.005–1.030)
pH: 5 (ref 5.0–8.0)

## 2023-04-20 LAB — VITAMIN B12: Vitamin B-12: 353 pg/mL (ref 180–914)

## 2023-04-20 LAB — DIFFERENTIAL
Abs Immature Granulocytes: 0.37 10*3/uL — ABNORMAL HIGH (ref 0.00–0.07)
Basophils Absolute: 0.1 10*3/uL (ref 0.0–0.1)
Basophils Relative: 0 %
Eosinophils Absolute: 0 10*3/uL (ref 0.0–0.5)
Eosinophils Relative: 0 %
Immature Granulocytes: 2 %
Lymphocytes Relative: 4 %
Lymphs Abs: 1 10*3/uL (ref 0.7–4.0)
Monocytes Absolute: 1.8 10*3/uL — ABNORMAL HIGH (ref 0.1–1.0)
Monocytes Relative: 7 %
Neutro Abs: 21.4 10*3/uL — ABNORMAL HIGH (ref 1.7–7.7)
Neutrophils Relative %: 87 %

## 2023-04-20 LAB — IRON AND TIBC
Iron: 26 ug/dL — ABNORMAL LOW (ref 45–182)
Saturation Ratios: 15 % — ABNORMAL LOW (ref 17.9–39.5)
TIBC: 175 ug/dL — ABNORMAL LOW (ref 250–450)
UIBC: 149 ug/dL

## 2023-04-20 LAB — RETICULOCYTES
Immature Retic Fract: 21.5 % — ABNORMAL HIGH (ref 2.3–15.9)
RBC.: 2.57 MIL/uL — ABNORMAL LOW (ref 4.22–5.81)
Retic Count, Absolute: 89.2 10*3/uL (ref 19.0–186.0)
Retic Ct Pct: 3.5 % — ABNORMAL HIGH (ref 0.4–3.1)

## 2023-04-20 LAB — LACTIC ACID, PLASMA: Lactic Acid, Venous: 2.5 mmol/L (ref 0.5–1.9)

## 2023-04-20 LAB — CBC
HCT: 27.3 % — ABNORMAL LOW (ref 39.0–52.0)
HCT: 22.3 % — ABNORMAL LOW (ref 39.0–52.0)
Hemoglobin: 8.4 g/dL — ABNORMAL LOW (ref 13.0–17.0)
Hemoglobin: 7.1 g/dL — ABNORMAL LOW (ref 13.0–17.0)
MCH: 28.7 pg (ref 26.0–34.0)
MCH: 28.9 pg (ref 26.0–34.0)
MCHC: 30.8 g/dL (ref 30.0–36.0)
MCHC: 31.8 g/dL (ref 30.0–36.0)
MCV: 93.2 fL (ref 80.0–100.0)
MCV: 90.7 fL (ref 80.0–100.0)
Platelets: 227 10*3/uL (ref 150–400)
Platelets: 178 10*3/uL (ref 150–400)
RBC: 2.93 MIL/uL — ABNORMAL LOW (ref 4.22–5.81)
RBC: 2.46 MIL/uL — ABNORMAL LOW (ref 4.22–5.81)
RDW: 16.7 % — ABNORMAL HIGH (ref 11.5–15.5)
RDW: 16.8 % — ABNORMAL HIGH (ref 11.5–15.5)
WBC: 24.6 10*3/uL — ABNORMAL HIGH (ref 4.0–10.5)
WBC: 14.6 10*3/uL — ABNORMAL HIGH (ref 4.0–10.5)
nRBC: 0 % (ref 0.0–0.2)
nRBC: 0 % (ref 0.0–0.2)

## 2023-04-20 LAB — I-STAT CHEM 8, ED
BUN: 33 mg/dL — ABNORMAL HIGH (ref 8–23)
Calcium, Ion: 1.06 mmol/L — ABNORMAL LOW (ref 1.15–1.40)
Chloride: 102 mmol/L (ref 98–111)
Creatinine, Ser: 1.9 mg/dL — ABNORMAL HIGH (ref 0.61–1.24)
Glucose, Bld: 179 mg/dL — ABNORMAL HIGH (ref 70–99)
HCT: 24 % — ABNORMAL LOW (ref 39.0–52.0)
Hemoglobin: 8.2 g/dL — ABNORMAL LOW (ref 13.0–17.0)
Potassium: 4.5 mmol/L (ref 3.5–5.1)
Sodium: 135 mmol/L (ref 135–145)
TCO2: 20 mmol/L — ABNORMAL LOW (ref 22–32)

## 2023-04-20 LAB — ABO/RH: ABO/RH(D): A POS

## 2023-04-20 LAB — RAPID URINE DRUG SCREEN, HOSP PERFORMED
Amphetamines: NOT DETECTED
Barbiturates: NOT DETECTED
Benzodiazepines: NOT DETECTED
Cocaine: NOT DETECTED
Opiates: NOT DETECTED
Tetrahydrocannabinol: NOT DETECTED

## 2023-04-20 LAB — PROTIME-INR
INR: 1.5 — ABNORMAL HIGH (ref 0.8–1.2)
Prothrombin Time: 18.4 s — ABNORMAL HIGH (ref 11.4–15.2)

## 2023-04-20 LAB — TROPONIN I (HIGH SENSITIVITY)
Troponin I (High Sensitivity): 260 ng/L (ref <18)
Troponin I (High Sensitivity): 326 ng/L (ref <18)

## 2023-04-20 LAB — FERRITIN: Ferritin: 2155 ng/mL — ABNORMAL HIGH (ref 24–336)

## 2023-04-20 LAB — PREPARE RBC (CROSSMATCH)

## 2023-04-20 LAB — APTT: aPTT: 33 s (ref 24–36)

## 2023-04-20 LAB — FOLATE: Folate: 8 ng/mL (ref >5.9)

## 2023-04-20 LAB — ETHANOL: Alcohol, Ethyl (B): <10 mg/dL (ref <10)

## 2023-04-20 MED ORDER — SODIUM CHLORIDE 0.9% IV SOLUTION: Freq: Once | INTRAVENOUS | Status: AC

## 2023-04-20 MED ORDER — PREDNISONE 20 MG PO TABS
40.0000 mg | ORAL_TABLET | Freq: Every day | ORAL | Status: DC
Start: 2023-04-20 — End: 2023-04-24
  Administered 2023-04-20: 40 mg via ORAL
  Filled 2023-04-20 (×2): qty 2

## 2023-04-20 MED ORDER — ACETAMINOPHEN 325 MG PO TABS
650.0000 mg | ORAL_TABLET | Freq: Four times a day (QID) | ORAL | Status: DC | PRN
  Administered 2023-04-20 – 2023-04-21 (×2): 650 mg via ORAL
  Filled 2023-04-20 (×2): qty 2

## 2023-04-20 MED ORDER — METOPROLOL TARTRATE 5 MG/5ML IV SOLN: 5.0000 mg | Freq: Three times a day (TID) | INTRAVENOUS | Status: DC | PRN

## 2023-04-20 MED ORDER — METRONIDAZOLE 500 MG/100ML IV SOLN
500.0000 mg | Freq: Two times a day (BID) | INTRAVENOUS | Status: DC
Start: 2023-04-20 — End: 2023-04-21
  Administered 2023-04-20: 500 mg via INTRAVENOUS
  Filled 2023-04-20: qty 100

## 2023-04-20 MED ORDER — PANTOPRAZOLE SODIUM 40 MG PO TBEC
40.0000 mg | DELAYED_RELEASE_TABLET | Freq: Every day | ORAL | Status: DC
  Administered 2023-04-20 – 2023-04-21 (×2): 40 mg via ORAL
  Filled 2023-04-20 (×3): qty 1

## 2023-04-20 MED ORDER — SODIUM CHLORIDE 0.9 % IV BOLUS
1000.0000 mL | Freq: Once | INTRAVENOUS | Status: AC
  Administered 2023-04-20: 1000 mL via INTRAVENOUS

## 2023-04-20 MED ORDER — ONDANSETRON HCL 4 MG PO TABS
4.0000 mg | ORAL_TABLET | Freq: Four times a day (QID) | ORAL | Status: DC | PRN
  Filled 2023-04-20: qty 1

## 2023-04-20 MED ORDER — VANCOMYCIN HCL IN DEXTROSE 1-5 GM/200ML-% IV SOLN
1000.0000 mg | Freq: Once | INTRAVENOUS | Status: AC
  Administered 2023-04-20: 1000 mg via INTRAVENOUS
  Filled 2023-04-20: qty 200

## 2023-04-20 MED ORDER — OXYCODONE HCL 5 MG PO TABS
5.0000 mg | ORAL_TABLET | ORAL | Status: DC | PRN
  Administered 2023-04-21 – 2023-04-22 (×5): 5 mg via ORAL
  Filled 2023-04-20 (×5): qty 1

## 2023-04-20 MED ORDER — MORPHINE SULFATE (PF) 2 MG/ML IV SOLN
2.0000 mg | INTRAVENOUS | Status: DC | PRN
  Administered 2023-04-21 – 2023-04-23 (×6): 2 mg via INTRAVENOUS
  Filled 2023-04-20 (×6): qty 1

## 2023-04-20 MED ORDER — SODIUM CHLORIDE 0.9 % IV SOLN
2.0000 g | Freq: Once | INTRAVENOUS | Status: AC
  Administered 2023-04-20: 2 g via INTRAVENOUS
  Filled 2023-04-20: qty 12.5

## 2023-04-20 MED ORDER — LACTATED RINGERS IV BOLUS
1000.0000 mL | Freq: Once | INTRAVENOUS | Status: AC
  Administered 2023-04-20: 1000 mL via INTRAVENOUS

## 2023-04-20 MED ORDER — SODIUM CHLORIDE 0.9 % IV SOLN
2.0000 g | Freq: Two times a day (BID) | INTRAVENOUS | Status: DC
  Administered 2023-04-21: 2 g via INTRAVENOUS
  Filled 2023-04-20: qty 12.5

## 2023-04-20 MED ORDER — LEVOTHYROXINE SODIUM 112 MCG PO TABS
112.0000 ug | ORAL_TABLET | Freq: Every day | ORAL | Status: DC
Start: 2023-04-21 — End: 2023-04-24
  Administered 2023-04-21: 112 ug via ORAL
  Filled 2023-04-20 (×2): qty 1

## 2023-04-20 MED ORDER — ACETAMINOPHEN 650 MG RE SUPP: 650.0000 mg | Freq: Four times a day (QID) | RECTAL | Status: DC | PRN

## 2023-04-20 MED ORDER — METRONIDAZOLE 500 MG/100ML IV SOLN
500.0000 mg | Freq: Once | INTRAVENOUS | Status: AC
  Administered 2023-04-20: 500 mg via INTRAVENOUS
  Filled 2023-04-20: qty 100

## 2023-04-20 MED ORDER — PREDNISONE 5 MG PO TABS: 30.0000 mg | ORAL_TABLET | Freq: Every day | ORAL | Status: DC

## 2023-04-20 MED ORDER — LACTATED RINGERS IV SOLN: INTRAVENOUS | Status: AC

## 2023-04-20 MED ORDER — LACTATED RINGERS IV BOLUS (SEPSIS)
500.0000 mL | Freq: Once | INTRAVENOUS | Status: AC
  Administered 2023-04-20: 500 mL via INTRAVENOUS

## 2023-04-20 MED ORDER — ONDANSETRON HCL 4 MG/2ML IJ SOLN: 4.0000 mg | Freq: Four times a day (QID) | INTRAMUSCULAR | Status: DC | PRN

## 2023-04-20 MED ORDER — VANCOMYCIN VARIABLE DOSE PER UNSTABLE RENAL FUNCTION (PHARMACIST DOSING): Status: DC

## 2023-04-20 MED ORDER — PREDNISONE 5 MG PO TABS: 10.0000 mg | ORAL_TABLET | Freq: Every day | ORAL | Status: DC

## 2023-04-20 MED ORDER — IOHEXOL 350 MG/ML SOLN
100.0000 mL | Freq: Once | INTRAVENOUS | Status: AC | PRN
  Administered 2023-04-20: 100 mL via INTRAVENOUS

## 2023-04-20 MED ORDER — PREDNISONE 20 MG PO TABS: 20.0000 mg | ORAL_TABLET | Freq: Every day | ORAL | Status: DC

## 2023-04-20 NOTE — Assessment & Plan Note (Addendum)
 73 year old presenting with weakness and found to be in severe sepsis with leukocytosis of 24.6, tachycardia, lactic acidosis and acute on chronic kidney injury with no clear source -admit to progressive -pan cultured -IVF resuscitated, continue IVF overnight  -CXR clear and urine does not look infected  -continue broad spectrum abx -trend lactic acid -on Lenvatinib 10mg  for newly diagnosed RCC with mets to lung. Keytruda held 2/25.  -CT abdomen/pelvis: 21 x 17 cm heterogeneous mass is seen involving right kidney consistent with renal cell carcinoma which has significantly enlarged since prior exam. Mild to moderate perinephric stranding is noted. The mass appears to extend into the anterior abdominal wall in the right upper quadrant of the abdomen. -Unfortunately he has had significant growth despite targeted therapy and now with worsening pain. Do not think any surgery can be done. Dr. Cherly Hensen will see him. Likely will need palliative/hospice.

## 2023-04-20 NOTE — ED Provider Notes (Signed)
 [place] EMERGENCY DEPARTMENT AT [facility] Provider Note   CSN: [id number] Arrival date & time: 04/20/23  4098  An emergency department physician performed an initial assessment on this suspected stroke patient at 50.  History  Chief Complaint  Patient presents with   Code Stroke    Jay Chapman is a 73 y.o. male.  Pt with right renal cell cancer, presents with feeling faint, weak, ?syncopal episode this AM. EMS noted some aphasia and activated code stroke pta.  Pt indicates relatively poor po intake, decreased appetite compared to baseline. Denies recent infusion therapy, is taking lenvima. Pt on prednisone, tapering dose. Also on eliquis - denies abnormal bleeding or bruising. Denies melena or rectal bleeding. No new or worsening abd pain. No dysuria. No fever or chills. No chest pain or sob. No cough or uri symptoms. Denies trauma/injury post syncope.   The history is provided by the patient, the EMS personnel, the spouse and medical records. The history is limited by the condition of the patient.       Home Medications Prior to Admission medications   Medication Sig Start Date End Date Taking? Authorizing Provider  amLODipine (NORVASC) 5 MG tablet TAKE 1 TABLET EVERY DAY 11/29/22   [name], MD  apixaban (ELIQUIS) 5 MG TABS tablet Take 1 tablet (5 mg total) by mouth 2 (two) times daily. 03/20/23   Melven Sartorius, MD  benazepril (LOTENSIN) 40 MG tablet TAKE 1 TABLET EVERY DAY 11/29/22   [name], MD  Cholecalciferol 50 MCG (2000 UT) TABS 1 tab by mouth once daily 11/24/20   [name], MD  lenvatinib 10 mg daily dose [facility]) capsule Take 1 capsule (10 mg total) by mouth daily. 03/27/23   Melven Sartorius, MD  levothyroxine (SYNTHROID) 112 MCG tablet TAKE 1 TABLET EVERY DAY 11/29/22   [name], MD  metroNIDAZOLE (METROCREAM) 0.75 % cream Apply topically 2 (two) times daily. 05/06/20   [name], MD  omeprazole (PRILOSEC) 20 MG capsule Take 1  capsule (20 mg total) by mouth daily. 03/05/23   Melven Sartorius, MD  ondansetron (ZOFRAN) 8 MG tablet Take 8 mg by mouth every 8 (eight) hours as needed for nausea or vomiting.    [provider]  ondansetron (ZOFRAN) 8 MG tablet Take 1 tablet (8 mg total) by mouth every 8 (eight) hours as needed for nausea or vomiting. 03/05/23   Melven Sartorius, MD  predniSONE (DELTASONE) 10 MG tablet Take 6 tablets (60 mg total) by mouth daily for 7 days, THEN 4 tablets (40 mg total) daily for 7 days, THEN 3 tablets (30 mg total) daily for 7 days, THEN 2 tablets (20 mg total) daily for 7 days, THEN 1 tablet (10 mg total) daily for 7 days. 04/10/23 05/15/23  Melven Sartorius, MD  prochlorperazine (COMPAZINE) 10 MG tablet Take 1 tablet (10 mg total) by mouth every 6 (six) hours as needed for nausea or vomiting. 03/05/23   Melven Sartorius, MD      Allergies    Doxycycline monohydrate    Review of Systems   Review of Systems  Constitutional:  Negative for chills and fever.  HENT:  Negative for sore throat.   Eyes:  Negative for visual disturbance.  Respiratory:  Negative for cough and shortness of breath.   Cardiovascular:  Negative for chest pain and leg swelling.  Gastrointestinal:  Negative for abdominal pain, blood in stool, diarrhea and vomiting.  Genitourinary:  Negative for dysuria and flank pain.  Musculoskeletal:  Negative for back pain and neck pain.  Neurological:  Positive for syncope and light-headedness. Negative for numbness and headaches.    Physical Exam Updated Vital Signs BP (!) 145/78   Pulse 98   Temp 99.5 F (37.5 C) (Oral)   Resp 17   Ht 1.854 m (6\' 1" )   Wt 97.4 kg   SpO2 98%   BMI 28.33 kg/m  Physical Exam Vitals and nursing note reviewed.  Constitutional:      Appearance: Normal appearance. He is well-developed.  HENT:     Head: Atraumatic.     Nose: Nose normal.     Mouth/Throat:     Mouth: Mucous membranes are moist.     Pharynx: Oropharynx is clear.  Eyes:      General: No scleral icterus.    Conjunctiva/sclera: Conjunctivae normal.     Pupils: Pupils are equal, round, and reactive to light.  Neck:     Trachea: No tracheal deviation.  Cardiovascular:     Rate and Rhythm: Normal rate and regular rhythm.     Pulses: Normal pulses.     Heart sounds: Normal heart sounds. No murmur heard.    No friction rub. No gallop.  Pulmonary:     Effort: Pulmonary effort is normal. No accessory muscle usage or respiratory distress.     Breath sounds: Normal breath sounds.  Abdominal:     General: Bowel sounds are normal. There is no distension.     Palpations: Abdomen is soft. There is mass.     Tenderness: There is no abdominal tenderness. There is no guarding.     Comments: Large firm ruq mass.   Genitourinary:    Comments: No cva tenderness. Rectal exam - scant brown stool, no gross blood or melena - sent for hemoccult.  Musculoskeletal:        General: No swelling.     Cervical back: Normal range of motion and neck supple. No rigidity.  Skin:    General: Skin is warm and dry.     Findings: No rash.  Neurological:     Mental Status: He is alert.     Comments: Alert, speech clear. No dysarthria or aphasia noted. Motor/sens grossly intact bil.   Psychiatric:        Mood and Affect: Mood normal.     ED Results / Procedures / Treatments   Labs (all labs ordered are listed, but only abnormal results are displayed) Results for orders placed or performed during the hospital encounter of 04/20/23  Ethanol   Collection Time: 04/20/23  8:40 AM  Result Value Ref Range   Alcohol, Ethyl (B) <10 <10 mg/dL  Protime-INR   Collection Time: 04/20/23  8:40 AM  Result Value Ref Range   Prothrombin Time 18.4 (H) 11.4 - 15.2 seconds   INR 1.5 (H) 0.8 - 1.2  APTT   Collection Time: 04/20/23  8:40 AM  Result Value Ref Range   aPTT 33 24 - 36 seconds  CBC   Collection Time: 04/20/23  8:40 AM  Result Value Ref Range   WBC 24.6 (H) 4.0 - 10.5 K/uL   RBC  2.93 (L) 4.22 - 5.81 MIL/uL   Hemoglobin 8.4 (L) 13.0 - 17.0 g/dL   HCT 16.1 (L) 09.6 - 04.5 %   MCV 93.2 80.0 - 100.0 fL   MCH 28.7 26.0 - 34.0 pg   MCHC 30.8 30.0 - 36.0 g/dL  RDW 16.7 (H) 11.5 - 15.5 %   Platelets 227 150 - 400 K/uL   nRBC 0.0 0.0 - 0.2 %  Differential   Collection Time: 04/20/23  8:40 AM  Result Value Ref Range   Neutrophils Relative % 87 %   Neutro Abs 21.4 (H) 1.7 - 7.7 K/uL   Lymphocytes Relative 4 %   Lymphs Abs 1.0 0.7 - 4.0 K/uL   Monocytes Relative 7 %   Monocytes Absolute 1.8 (H) 0.1 - 1.0 K/uL   Eosinophils Relative 0 %   Eosinophils Absolute 0.0 0.0 - 0.5 K/uL   Basophils Relative 0 %   Basophils Absolute 0.1 0.0 - 0.1 K/uL   Immature Granulocytes 2 %   Abs Immature Granulocytes 0.37 (H) 0.00 - 0.07 K/uL  Comprehensive metabolic panel   Collection Time: 04/20/23  8:40 AM  Result Value Ref Range   Sodium 137 135 - 145 mmol/L   Potassium 4.6 3.5 - 5.1 mmol/L   Chloride 102 98 - 111 mmol/L   CO2 19 (L) 22 - 32 mmol/L   Glucose, Bld 188 (H) 70 - 99 mg/dL   BUN 35 (H) 8 - 23 mg/dL   Creatinine, Ser 1.61 (H) 0.61 - 1.24 mg/dL   Calcium 8.1 (L) 8.9 - 10.3 mg/dL   Total Protein 5.3 (L) 6.5 - 8.1 g/dL   Albumin 2.4 (L) 3.5 - 5.0 g/dL   AST 47 (H) 15 - 41 U/L   ALT 81 (H) 0 - 44 U/L   Alkaline Phosphatase 211 (H) 38 - 126 U/L   Total Bilirubin 1.9 (H) 0.0 - 1.2 mg/dL   GFR, Estimated 34 (L) >60 mL/min   Anion gap 16 (H) 5 - 15  I-stat chem 8, ED   Collection Time: 04/20/23  8:43 AM  Result Value Ref Range   Sodium 135 135 - 145 mmol/L   Potassium 4.5 3.5 - 5.1 mmol/L   Chloride 102 98 - 111 mmol/L   BUN 33 (H) 8 - 23 mg/dL   Creatinine, Ser 0.96 (H) 0.61 - 1.24 mg/dL   Glucose, Bld 045 (H) 70 - 99 mg/dL   Calcium, Ion 4.09 (L) 1.15 - 1.40 mmol/L   TCO2 20 (L) 22 - 32 mmol/L   Hemoglobin 8.2 (L) 13.0 - 17.0 g/dL   HCT 81.1 (L) 91.4 - 78.2 %  Iron and TIBC   Collection Time: 04/20/23  9:30 AM  Result Value Ref Range   Iron 26 (L) 45 - 182  ug/dL   TIBC 956 (L) 213 - 086 ug/dL   Saturation Ratios 15 (L) 17.9 - 39.5 %   UIBC 149 ug/dL  Ferritin   Collection Time: 04/20/23  9:30 AM  Result Value Ref Range   Ferritin 2,155 (H) 24 - 336 ng/mL  Reticulocytes   Collection Time: 04/20/23  9:30 AM  Result Value Ref Range   Retic Ct Pct 3.5 (H) 0.4 - 3.1 %   RBC. 2.57 (L) 4.22 - 5.81 MIL/uL   Retic Count, Absolute 89.2 19.0 - 186.0 K/uL   Immature Retic Fract 21.5 (H) 2.3 - 15.9 %  Vitamin B12   Collection Time: 04/20/23  9:30 AM  Result Value Ref Range   Vitamin B-12 353 180 - 914 pg/mL  Folate   Collection Time: 04/20/23  9:30 AM  Result Value Ref Range   Folate 8.0 >5.9 ng/mL  ABO/Rh   Collection Time: 04/20/23  9:30 AM  Result Value Ref Range   ABO/RH(D)  A POS Performed at [facility], [address], [place], [place] [address]   Troponin I (High Sensitivity)   Collection Time: 04/20/23  9:30 AM  Result Value Ref Range   Troponin I (High Sensitivity) 260 (HH) <18 ng/L  Type and screen [facility]   Collection Time: 04/20/23  9:35 AM  Result Value Ref Range   ABO/RH(D) A POS    Antibody Screen NEG    Sample Expiration      04/23/2023,2359 Performed at [facility], [address] [place], [place] [address]   I-Stat CG4 Lactic Acid   Collection Time: 04/20/23  9:44 AM  Result Value Ref Range   Lactic Acid, Venous 8.4 (HH) 0.5 - 1.9 mmol/L   Comment NOTIFIED PHYSICIAN   Urine rapid drug screen (hosp performed)   Collection Time: 04/20/23 10:54 AM  Result Value Ref Range   Opiates NONE DETECTED NONE DETECTED   Cocaine NONE DETECTED NONE DETECTED   Benzodiazepines NONE DETECTED NONE DETECTED   Amphetamines NONE DETECTED NONE DETECTED   Tetrahydrocannabinol NONE DETECTED NONE DETECTED   Barbiturates NONE DETECTED NONE DETECTED  Urinalysis, Routine w reflex microscopic -Urine, Clean Catch   Collection Time: 04/20/23 10:54 AM  Result Value Ref Range   Color, Urine AMBER  (A) YELLOW   APPearance HAZY (A) CLEAR   Specific Gravity, Urine 1.041 (H) 1.005 - 1.030   pH 5.0 5.0 - 8.0   Glucose, UA NEGATIVE NEGATIVE mg/dL   Hgb urine dipstick SMALL (A) NEGATIVE   Bilirubin Urine NEGATIVE NEGATIVE   Ketones, ur NEGATIVE NEGATIVE mg/dL   Protein, ur 30 (A) NEGATIVE mg/dL   Nitrite NEGATIVE NEGATIVE   Leukocytes,Ua NEGATIVE NEGATIVE   RBC / HPF 6-10 0 - 5 RBC/hpf   WBC, UA 0-5 0 - 5 WBC/hpf   Bacteria, UA FEW (A) NONE SEEN   Squamous Epithelial / HPF 0-5 0 - 5 /HPF   Mucus PRESENT    Granular Casts, UA PRESENT    Amorphous Crystal PRESENT   I-Stat CG4 Lactic Acid   Collection Time: 04/20/23 11:27 AM  Result Value Ref Range   Lactic Acid, Venous 6.1 (HH) 0.5 - 1.9 mmol/L   Comment NOTIFIED PHYSICIAN   I-Stat CG4 Lactic Acid   Collection Time: 04/20/23  1:54 PM  Result Value Ref Range   Lactic Acid, Venous 3.9 (HH) 0.5 - 1.9 mmol/L   Comment NOTIFIED PHYSICIAN    DG Chest Port 1 View Result Date: 04/20/2023 CLINICAL DATA:  Weakness and stroke EXAM: PORTABLE CHEST 1 VIEW COMPARISON:  CT chest dated 02/12/2023 FINDINGS: Normal lung volumes. Left basilar patchy and linear opacity. No pleural effusion or pneumothorax. The heart size and mediastinal contours are within normal limits. No acute osseous abnormality. IMPRESSION: Left basilar patchy and linear opacity, likely atelectasis. Electronically Signed   By: Agustin Cree M.D.   On: 04/20/2023 11:48   CT VENOGRAM HEAD Result Date: 04/20/2023 CLINICAL DATA:  73 year old male code stroke presentation with aphasia. EXAM: CT VENOGRAM HEAD TECHNIQUE: Venographic phase images of the brain were obtained following the administration of intravenous contrast. Multiplanar reformats and maximum intensity projections were generated. RADIATION DOSE REDUCTION: This exam was performed according to the departmental dose-optimization program which includes automated exposure control, adjustment of the mA and/or kV according to patient  size and/or use of iterative reconstruction technique. CONTRAST:  OMNIPAQUE IOHEXOL 350 MG/ML SOLN COMPARISON:  CTA, CTP, and noncontrast Head CT today reported separately. FINDINGS: Venous  sinuses: Superior sagittal sinus, torcula, straight sinus, vein of Galen, internal cerebral veins, inferior sagittal sinus, left greater than right transverse and sigmoid venous sinuses are enhancing and patent. Left IJ bulb also appears to be dominant, patent. Bilateral cavernous sinus seen to be enhancing on series 5, image 14 Anatomic variants: Dominant left transverse, sigmoid sinus, left IJ bulb. Other findings: 5 minute delayed images of the brain demonstrate no abnormal intracranial enhancement. Review of the MIP images confirms the above findings IMPRESSION: Negative CT Venogram, no evidence of dural venous sinus thrombosis. Electronically Signed   By: Odessa Fleming M.D.   On: 04/20/2023 11:10   CT ANGIO HEAD NECK W WO CM W PERF (CODE STROKE) Result Date: 04/20/2023 CLINICAL DATA:  73 year old male code stroke presentation with aphasia. Neurologic deficit. EXAM: CT ANGIOGRAPHY HEAD AND NECK CT PERFUSION BRAIN TECHNIQUE: Multidetector CT imaging of the head and neck was performed using the standard protocol during bolus administration of intravenous contrast. Multiplanar CT image reconstructions and MIPs were obtained to evaluate the vascular anatomy. Carotid stenosis measurements (when applicable) are obtained utilizing NASCET criteria, using the distal internal carotid diameter as the denominator. Multiphase CT imaging of the brain was performed following IV bolus contrast injection. Subsequent parametric perfusion maps were calculated using RAPID software. RADIATION DOSE REDUCTION: This exam was performed according to the departmental dose-optimization program which includes automated exposure control, adjustment of the mA and/or kV according to patient size and/or use of iterative reconstruction technique. CONTRAST:   OMNIPAQUE IOHEXOL 350 MG/ML SOLN COMPARISON:  Plain head CT 0843 hours today. FINDINGS: CT Brain Perfusion Findings: ASPECTS: 10 CBF (<30%) Volume: 0mL Perfusion (Tmax>6.0s) volume: 0mL Mismatch Volume: Not applicable Infarction Location:Not applicable CTA NECK Skeleton: mild chronic appearing C7 and upper thoracic superior endplate compression. No acute osseous abnormality identified. Upper chest: Trace layering pleural fluid in both lung apices. Otherwise negative. Other neck: Neck soft tissue spaces are within normal limits. Aortic arch: Mildly tortuous 3 vessel arch.  Mild atherosclerosis. Right carotid system: Tortuous brachiocephalic artery and proximal right CCA. Patent right carotid bifurcation. Minimal atherosclerosis with no stenosis. Left carotid system: Mildly tortuous left CCA. Patent left carotid bifurcation with minimal atherosclerosis, no stenosis. Vertebral arteries: Tortuous proximal right subclavian artery with mild calcified plaque, no stenosis. Normal right vertebral artery origin. Right vertebral artery is patent to the skull base, mildly irregular. But there is no significant stenosis or discrete atherosclerosis. Proximal left subclavian artery is patent with minimal atherosclerosis. Left vertebral artery origin is patent, left V1 segment appears stenotic due to soft plaque (series 10, image 122). But the left vertebral remains patent. Non dominant appearance of the left vertebral artery, patent to the skull base without additional stenosis. CTA HEAD Posterior circulation: Right V4 segment is dominant and patent to the vertebrobasilar junction without stenosis. Left V4 functionally terminates in PICA although a small distal left V4 continues to the vertebrobasilar junction. Mild if any distal vertebral plaque without stenosis. Patent basilar artery with evidence of calcified plaque but no stenosis. Patent SCA and left PCA origin. Fetal type right PCA origin. Small left posterior  communicating artery also present. Bilateral PCA branches are within normal limits. Anterior circulation: Both ICA siphons are patent with mild tortuosity, mild calcified plaque. No significant siphon stenosis. Normal posterior communicating artery origins. Patent carotid termini. Normal MCA and ACA origins. Diminutive bilateral ACAs with diminutive or absent anterior communicating artery. Diminutive but patent bilateral ACA branches with no focal abnormality. Left MCA M1 segment is  tortuous and left MCA bifurcation is patent without stenosis. Right MCA M1 segment similarly tortuous and patent right MCA bifurcation without stenosis. Bilateral MCA branches are within normal limits. Venous sinuses: Patent. Anatomic variants: Dominant right vertebral artery. Fetal type right PCA origin. Review of the MIP images confirms the above findings IMPRESSION: 1. CTA is negative for large vessel occlusion. Negative CT Perfusion. 2. Evidence of significant Left Vertebral artery V1 segment Stenosis due to Soft Plaque. But minimal to mild for age atherosclerosis otherwise in the head and neck. No other significant stenosis. 3.  Aortic Atherosclerosis (ICD10-I70.0). Salient findings were communicated to Dr. Amada Jupiter at [phone] hours on 04/20/2023 by text page via the [facility] messaging system. Electronically Signed   By: Odessa Fleming M.D.   On: 04/20/2023 09:18   CT HEAD CODE STROKE WO CONTRAST Result Date: 04/20/2023 CLINICAL DATA:  Code stroke.  73 year old male with aphasia. EXAM: CT HEAD WITHOUT CONTRAST TECHNIQUE: Contiguous axial images were obtained from the base of the skull through the vertex without intravenous contrast. RADIATION DOSE REDUCTION: This exam was performed according to the departmental dose-optimization program which includes automated exposure control, adjustment of the mA and/or kV according to patient size and/or use of iterative reconstruction technique. COMPARISON:  Brain MRI 03/14/2023. FINDINGS: Brain: Stable  cerebral volume since the January MRI. No midline shift, mass effect, or evidence of intracranial mass lesion. Stable ventricle size and configuration. No acute intracranial hemorrhage identified. Patchy bilateral white matter hypodensity appears stable to FLAIR signal changes on previous MRI. No acute or chronic cortically based infarct identified. Vascular: Calcified atherosclerosis at the skull base. No suspicious intracranial vascular hyperdensity. Skull: Intact.  No acute osseous abnormality identified. Sinuses/Orbits: Well aerated paranasal sinuses, mild maxillary mucosal thickening or retention cysts. Other: No gaze deviation. Visualized scalp soft tissues are within normal limits. ASPECTS [facility] Stroke Program Early CT Score) Total score (0-10 with 10 being normal): 10 IMPRESSION: 1. No acute cortically based infarct or acute intracranial hemorrhage identified. ASPECTS 10. 2. White matter disease appears stable to January MRI. 3. These results were communicated to Dr. Amada Jupiter at 8:51 am on 04/20/2023 by text page via the [facility] messaging system. Electronically Signed   By: Odessa Fleming M.D.   On: 04/20/2023 08:52    ED ECG REPORT   Date: 04/20/2023  Rate: 112  Rhythm: sinus tachycardia  QRS Axis: normal  Intervals: normal  ST/T Wave abnormalities: nonspecific ST/T changes  Conduction Disutrbances:nonspecific intraventricular conduction delay  Narrative Interpretation:   Old EKG Reviewed: changes noted  I have personally reviewed the EKG tracing    Radiology DG Chest Port 1 View Result Date: 04/20/2023 CLINICAL DATA:  Weakness and stroke EXAM: PORTABLE CHEST 1 VIEW COMPARISON:  CT chest dated 02/12/2023 FINDINGS: Normal lung volumes. Left basilar patchy and linear opacity. No pleural effusion or pneumothorax. The heart size and mediastinal contours are within normal limits. No acute osseous abnormality. IMPRESSION: Left basilar patchy and linear opacity, likely atelectasis. Electronically  Signed   By: Agustin Cree M.D.   On: 04/20/2023 11:48   CT VENOGRAM HEAD Result Date: 04/20/2023 CLINICAL DATA:  73 year old male code stroke presentation with aphasia. EXAM: CT VENOGRAM HEAD TECHNIQUE: Venographic phase images of the brain were obtained following the administration of intravenous contrast. Multiplanar reformats and maximum intensity projections were generated. RADIATION DOSE REDUCTION: This exam was performed according to the departmental dose-optimization program which includes automated exposure control, adjustment of the mA and/or kV according to patient size and/or use of  iterative reconstruction technique. CONTRAST:  OMNIPAQUE IOHEXOL 350 MG/ML SOLN COMPARISON:  CTA, CTP, and noncontrast Head CT today reported separately. FINDINGS: Venous sinuses: Superior sagittal sinus, torcula, straight sinus, vein of Galen, internal cerebral veins, inferior sagittal sinus, left greater than right transverse and sigmoid venous sinuses are enhancing and patent. Left IJ bulb also appears to be dominant, patent. Bilateral cavernous sinus seen to be enhancing on series 5, image 14 Anatomic variants: Dominant left transverse, sigmoid sinus, left IJ bulb. Other findings: 5 minute delayed images of the brain demonstrate no abnormal intracranial enhancement. Review of the MIP images confirms the above findings IMPRESSION: Negative CT Venogram, no evidence of dural venous sinus thrombosis. Electronically Signed   By: Odessa Fleming M.D.   On: 04/20/2023 11:10   CT ANGIO HEAD NECK W WO CM W PERF (CODE STROKE) Result Date: 04/20/2023 CLINICAL DATA:  73 year old male code stroke presentation with aphasia. Neurologic deficit. EXAM: CT ANGIOGRAPHY HEAD AND NECK CT PERFUSION BRAIN TECHNIQUE: Multidetector CT imaging of the head and neck was performed using the standard protocol during bolus administration of intravenous contrast. Multiplanar CT image reconstructions and MIPs were obtained to evaluate the vascular anatomy.  Carotid stenosis measurements (when applicable) are obtained utilizing NASCET criteria, using the distal internal carotid diameter as the denominator. Multiphase CT imaging of the brain was performed following IV bolus contrast injection. Subsequent parametric perfusion maps were calculated using RAPID software. RADIATION DOSE REDUCTION: This exam was performed according to the departmental dose-optimization program which includes automated exposure control, adjustment of the mA and/or kV according to patient size and/or use of iterative reconstruction technique. CONTRAST:  OMNIPAQUE IOHEXOL 350 MG/ML SOLN COMPARISON:  Plain head CT 0843 hours today. FINDINGS: CT Brain Perfusion Findings: ASPECTS: 10 CBF (<30%) Volume: 0mL Perfusion (Tmax>6.0s) volume: 0mL Mismatch Volume: Not applicable Infarction Location:Not applicable CTA NECK Skeleton: mild chronic appearing C7 and upper thoracic superior endplate compression. No acute osseous abnormality identified. Upper chest: Trace layering pleural fluid in both lung apices. Otherwise negative. Other neck: Neck soft tissue spaces are within normal limits. Aortic arch: Mildly tortuous 3 vessel arch.  Mild atherosclerosis. Right carotid system: Tortuous brachiocephalic artery and proximal right CCA. Patent right carotid bifurcation. Minimal atherosclerosis with no stenosis. Left carotid system: Mildly tortuous left CCA. Patent left carotid bifurcation with minimal atherosclerosis, no stenosis. Vertebral arteries: Tortuous proximal right subclavian artery with mild calcified plaque, no stenosis. Normal right vertebral artery origin. Right vertebral artery is patent to the skull base, mildly irregular. But there is no significant stenosis or discrete atherosclerosis. Proximal left subclavian artery is patent with minimal atherosclerosis. Left vertebral artery origin is patent, left V1 segment appears stenotic due to soft plaque (series 10, image 122). But the left  vertebral remains patent. Non dominant appearance of the left vertebral artery, patent to the skull base without additional stenosis. CTA HEAD Posterior circulation: Right V4 segment is dominant and patent to the vertebrobasilar junction without stenosis. Left V4 functionally terminates in PICA although a small distal left V4 continues to the vertebrobasilar junction. Mild if any distal vertebral plaque without stenosis. Patent basilar artery with evidence of calcified plaque but no stenosis. Patent SCA and left PCA origin. Fetal type right PCA origin. Small left posterior communicating artery also present. Bilateral PCA branches are within normal limits. Anterior circulation: Both ICA siphons are patent with mild tortuosity, mild calcified plaque. No significant siphon stenosis. Normal posterior communicating artery origins. Patent carotid termini. Normal MCA and ACA origins. Diminutive  bilateral ACAs with diminutive or absent anterior communicating artery. Diminutive but patent bilateral ACA branches with no focal abnormality. Left MCA M1 segment is tortuous and left MCA bifurcation is patent without stenosis. Right MCA M1 segment similarly tortuous and patent right MCA bifurcation without stenosis. Bilateral MCA branches are within normal limits. Venous sinuses: Patent. Anatomic variants: Dominant right vertebral artery. Fetal type right PCA origin. Review of the MIP images confirms the above findings IMPRESSION: 1. CTA is negative for large vessel occlusion. Negative CT Perfusion. 2. Evidence of significant Left Vertebral artery V1 segment Stenosis due to Soft Plaque. But minimal to mild for age atherosclerosis otherwise in the head and neck. No other significant stenosis. 3.  Aortic Atherosclerosis (ICD10-I70.0). Salient findings were communicated to Dr. Amada Jupiter at [phone] hours on 04/20/2023 by text page via the [facility] messaging system. Electronically Signed   By: Odessa Fleming M.D.   On: 04/20/2023 09:18   CT HEAD  CODE STROKE WO CONTRAST Result Date: 04/20/2023 CLINICAL DATA:  Code stroke.  73 year old male with aphasia. EXAM: CT HEAD WITHOUT CONTRAST TECHNIQUE: Contiguous axial images were obtained from the base of the skull through the vertex without intravenous contrast. RADIATION DOSE REDUCTION: This exam was performed according to the departmental dose-optimization program which includes automated exposure control, adjustment of the mA and/or kV according to patient size and/or use of iterative reconstruction technique. COMPARISON:  Brain MRI 03/14/2023. FINDINGS: Brain: Stable cerebral volume since the January MRI. No midline shift, mass effect, or evidence of intracranial mass lesion. Stable ventricle size and configuration. No acute intracranial hemorrhage identified. Patchy bilateral white matter hypodensity appears stable to FLAIR signal changes on previous MRI. No acute or chronic cortically based infarct identified. Vascular: Calcified atherosclerosis at the skull base. No suspicious intracranial vascular hyperdensity. Skull: Intact.  No acute osseous abnormality identified. Sinuses/Orbits: Well aerated paranasal sinuses, mild maxillary mucosal thickening or retention cysts. Other: No gaze deviation. Visualized scalp soft tissues are within normal limits. ASPECTS [facility] Stroke Program Early CT Score) Total score (0-10 with 10 being normal): 10 IMPRESSION: 1. No acute cortically based infarct or acute intracranial hemorrhage identified. ASPECTS 10. 2. White matter disease appears stable to January MRI. 3. These results were communicated to Dr. Amada Jupiter at 8:51 am on 04/20/2023 by text page via the [facility] messaging system. Electronically Signed   By: Odessa Fleming M.D.   On: 04/20/2023 08:52    Procedures Procedures    Medications Ordered in ED Medications  lactated ringers infusion ( Intravenous Infusion Verify 04/20/23 1347)  iohexol (OMNIPAQUE) 350 MG/ML injection 100 mL (100 mLs Intravenous Contrast Given  04/20/23 0902)  sodium chloride 0.9 % bolus 1,000 mL (0 mLs Intravenous Stopped 04/20/23 1000)  lactated ringers bolus 1,000 mL (0 mLs Intravenous Stopped 04/20/23 1055)  lactated ringers bolus 1,000 mL (0 mLs Intravenous Stopped 04/20/23 1200)  ceFEPIme (MAXIPIME) 2 g in sodium chloride 0.9 % 100 mL IVPB (0 g Intravenous Stopped 04/20/23 1255)  metroNIDAZOLE (FLAGYL) IVPB 500 mg (0 mg Intravenous Stopped 04/20/23 1312)  vancomycin (VANCOCIN) IVPB 1000 mg/200 mL premix (0 mg Intravenous Stopped 04/20/23 1341)  lactated ringers bolus 500 mL (0 mLs Intravenous Stopped 04/20/23 1341)    ED Course/ Medical Decision Making/ A&P                                 Medical Decision Making Problems Addressed: AKI (acute kidney injury) [facility]): acute illness or injury  Dehydration: acute illness or injury with systemic symptoms that poses a threat to life or bodily functions Elevated lactic acid level: acute illness or injury with systemic symptoms that poses a threat to life or bodily functions Elevated liver function tests: acute illness or injury Elevated troponin: acute illness or injury Hypotension due to hypovolemia: acute illness or injury with systemic symptoms that poses a threat to life or bodily functions Leukocytosis, unspecified type: acute illness or injury Metastatic renal cell carcinoma, right (HCC): chronic illness or injury with exacerbation, progression, or side effects of treatment that poses a threat to life or bodily functions Volume depletion: acute illness or injury with systemic symptoms that poses a threat to life or bodily functions  Amount and/or Complexity of Data Reviewed Independent Historian: EMS    Details: Ems and family, hx External Data Reviewed: labs, radiology and notes. Labs: ordered. Decision-making details documented in ED Course. Radiology: ordered and independent interpretation performed. Decision-making details documented in ED Course. ECG/medicine tests: ordered and  independent interpretation performed. Decision-making details documented in ED Course. Discussion of management or test interpretation with external provider(s): Neurology, medicine  Risk Prescription drug management. Decision regarding hospitalization.   Iv ns. Continuous pulse ox and cardiac monitoring. Labs ordered/sent. Imaging ordered.   Differential diagnosis includes cva, syncope, anemia, dehydration, etc. Dispo decision including potential need for admission considered - will get labs and imaging and reassess.   Reviewed nursing notes and prior charts for additional history. External reports reviewed. Additional history from: EMS, family.   Cardiac monitor: sinus rhythm, rate 120.   Labs reviewed/interpreted by me - wbc elevated. Pt is on prednisone. Denies fever. Hgb 8, lower than prior - will send hemoccult. Cr elevated, 1.9, c/w aki. LR bolus.   Episodes hypotension in ct, ivf, hypotension improved.   CT reviewed/interpreted by me - no hem.   Additional labs reviewed/interpreted by me  - lactate very high. LR 30 cc//kg. Denies fever/chills, and no source infxn currently, and does appear dehydrated/volume depleted - so will hold on abx for now.   Xrays reviewed/interpreted by me - ?infiltrate (vs atelectasis) - possible pna, will order broad spectrum iv abx.   2nd lactate improved, still high. Additional fluids. HR improved, bp is normal.  Repeat lactate pending. Ct abd pending.   Recheck, abd soft nt, vital stable. Hr improved, now normal. No cp or sob.   Hospitalists consulted for admission.  CRITICAL CARE RE: dehydration/volume depletion with episode hypotension, marked elevated lactic acid, aki, metastatic cancer.  Performed by: Suzi Roots Total critical care time: 110 minutes Critical care time was exclusive of separately billable procedures and treating other patients. Critical care was necessary to treat or prevent imminent or life-threatening  deterioration. Critical care was time spent personally by me on the following activities: development of treatment plan with patient and/or surrogate as well as nursing, discussions with consultants, evaluation of patient's response to treatment, examination of patient, obtaining history from patient or surrogate, ordering and performing treatments and interventions, ordering and review of laboratory studies, ordering and review of radiographic studies, pulse oximetry and re-evaluation of patient's condition.          Final Clinical Impression(s) / ED Diagnoses Final diagnoses:  None    Rx / DC Orders ED Discharge Orders     None         Cathren Laine, MD 04/20/23 1409
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
## 2023-04-20 NOTE — ED Notes (Signed)
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 normal): 10 IMPRESSION: 1. No acute cortically based infarct or acute intracranial hemorrhage identified. ASPECTS 10. 2. White matter disease appears stable to January MRI. 3. These results were communicated to Dr. Amada Jupiter at 8:51 am on 04/20/2023 by text page via the [facility] messaging system. Electronically Signed   By: Odessa Fleming M.D.   On: 04/20/2023 08:52    Pending Labs Unresulted Labs (From admission, onward)     Start     Ordered   04/21/23 0500  Comprehensive metabolic panel  Tomorrow morning,   R        04/20/23 1548   04/20/23 1549  Basic metabolic panel  ONCE - STAT,   STAT        04/20/23 1548   04/20/23 1501  Urine Culture (for pregnant,  neutropenic or urologic patients or patients with an indwelling urinary catheter)  (Urine Labs)  Once,   R       Question:  Indication  Answer:  Sepsis   04/20/23 1500   04/20/23 1456  Lactic acid, plasma  (Lactic Acid)  STAT Now then every 3 hours,   R      04/20/23 1455   04/20/23 1445  CBC  Now then every 6 hours,   R      04/20/23 1445            Vitals/Pain Today's Vitals   04/20/23 1814 04/20/23 1915 04/20/23 1939 04/20/23 2000  BP: 134/80 (!) 143/83  (!) 149/81  Pulse: (!) 103 96  93  Resp: (!) 31 10  (!) 24  Temp: 99.6 F (37.6 C)     TempSrc:      SpO2: 95% 95%  96%  Weight:      Height:      PainSc:   0-No pain     Isolation Precautions No active isolations  Medications Medications  lactated  ringers infusion (0 mLs Intravenous Paused 04/20/23 1924)  metroNIDAZOLE (FLAGYL) IVPB 500 mg (has no administration in time range)  acetaminophen (TYLENOL) tablet 650 mg (650 mg Oral Given 04/20/23 1658)    Or  acetaminophen (TYLENOL) suppository 650 mg ( Rectal See Alternative 04/20/23 1658)  oxyCODONE (Oxy IR/ROXICODONE) immediate release tablet 5 mg (has no administration in time range)  morphine (PF) 2 MG/ML injection 2 mg (has no administration in time range)  ondansetron (ZOFRAN) tablet 4 mg (has no administration in time range)    Or  ondansetron (ZOFRAN) injection 4 mg (has no administration in time range)  levothyroxine (SYNTHROID) tablet 112 mcg (has no administration in time range)  predniSONE (DELTASONE) tablet 40 mg (40 mg Oral Given 04/20/23 1654)    Followed by  predniSONE (DELTASONE) tablet 30 mg (has no administration in time range)    Followed by  predniSONE (DELTASONE) tablet 20 mg (has no administration in time range)    Followed by  predniSONE (DELTASONE) tablet 10 mg (has no administration in time range)  pantoprazole (PROTONIX) EC tablet 40 mg (40 mg Oral Given 04/20/23 1654)  metoprolol tartrate (LOPRESSOR) injection 5 mg (has no administration in time  range)  iohexol (OMNIPAQUE) 350 MG/ML injection 100 mL (100 mLs Intravenous Contrast Given 04/20/23 0902)  sodium chloride 0.9 % bolus 1,000 mL (0 mLs Intravenous Stopped 04/20/23 1000)  lactated ringers bolus 1,000 mL (0 mLs Intravenous Stopped 04/20/23 1055)  lactated ringers bolus 1,000 mL (0 mLs Intravenous Stopped 04/20/23 1200)  ceFEPIme (MAXIPIME) 2 g in sodium chloride 0.9 % 100 mL IVPB (0 g Intravenous Stopped 04/20/23 1255)  metroNIDAZOLE (FLAGYL) IVPB 500 mg (0 mg Intravenous Stopped 04/20/23 1312)  vancomycin (VANCOCIN) IVPB 1000 mg/200 mL premix (0 mg Intravenous Stopped 04/20/23 1341)  lactated ringers bolus 500 mL (0 mLs Intravenous Stopped 04/20/23 1341)  0.9 %  sodium chloride infusion (Manually program via Guardrails IV Fluids) ( Intravenous New Bag/Given 04/20/23 1759)    Mobility walks     Focused Assessments Neuro Assessment Handoff:  Swallow screen pass? Yes  Cardiac Rhythm: Normal sinus rhythm NIH Stroke Scale  Dizziness Present: No Headache Present: No Interval: Shift assessment Level of Consciousness (1a.)   : Alert, keenly responsive LOC Questions (1b. )   : Answers both questions correctly LOC Commands (1c. )   : Performs both tasks correctly Best Gaze (2. )  : Normal Visual (3. )  : No visual loss Facial Palsy (4. )    : Normal symmetrical movements Motor Arm, Left (5a. )   : No drift Motor Arm, Right (5b. ) : No drift Motor Leg, Left (6a. )  : No drift Motor Leg, Right (6b. ) : No drift Limb Ataxia (7. ): Absent Sensory (8. )  : Normal, no sensory loss Best Language (9. )  : No aphasia Dysarthria (10. ): Normal Extinction/Inattention (11.)   : No Abnormality Complete NIHSS TOTAL: 0 Last date known well: 04/19/23 Last time known well:  ("bedtime") Neuro Assessment: Within Defined Limits Neuro Checks:   Initial (04/20/23 0845)  Has TPA been given? No If patient is a Neuro Trauma and patient is going to OR before floor call report to 4N Charge nurse:  [phone] or [phone]   R Recommendations: See Admitting Provider Note  Report given to:   Additional Notes:

## 2023-04-20 NOTE — Code Documentation (Signed)
 Jay Chapman is a 73 year old male with a PMH of CKD, renal cell carcinoma, and thrombus of inferior vena cava arriving to [facility] via EMS on 04/20/2023. Pt is coming from home where he was last known well at bedtime last night. He had a syncopal episode witnessed by wife this AM and EMS was called. EMS noted aphasia, and therefore activated code stroke. Pt is on Eliquis.    Pt met by stroke team at bridge. Labs, CBG obtained. Airway cleared by EDP. Pt to CT with team. NIHSS 4. Pt with rt droop, Rt arm, bilateral leg drift. The following imaging was obtained: CT, CTA/P. Per Dr Amada Jupiter, CT negative for hemorrhage, and CTA is negative for LVO or perfusion abnormality.     Pt back to ED room 33 where his workup will continue. He is not eligible for TNK as he is outside of the treatment window and on Eliquis. He is not a candidate for thrombectomy as LVO negative. He will need q 2 hr NIHSS and VS. He will be NPO until passes a stroke swallow screen. Bedside handoff with ED RN complete.
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
## 2023-04-20 NOTE — ED Triage Notes (Signed)
 Pt BIBGEMS from home after having a syncopal episode. EMS unable to get a blood pressure originally, hr 130. 18 g L AC 500 NS given. Pt developed aphasia en route with EMS.   120 hr 114/83

## 2023-04-20 NOTE — Assessment & Plan Note (Addendum)
 On long steroid taper due to flair with chemo Started 2/25-05/15/23  Continue taper, no indication for stress dosing at this time

## 2023-04-20 NOTE — ED Notes (Signed)
MD notified of lactic level  

## 2023-04-20 NOTE — Progress Notes (Signed)
 Elink is following code sepsis.

## 2023-04-20 NOTE — ED Notes (Signed)
 Patient transported to CT

## 2023-04-20 NOTE — ED Notes (Signed)
 All labs were collected and sent down

## 2023-04-20 NOTE — Assessment & Plan Note (Addendum)
 Hypotension on arrival, but now normotensive to elevated bp Hold his benzapril in setting of AKI on CKD Will do PRN IV until med rec done and monitor pressure with rapid anemia requiring PRBC transfusion

## 2023-04-20 NOTE — Assessment & Plan Note (Addendum)
 Dx'd in January of 2025  Followed by oncology 03/08/23 started Lenvatinib 10 mg 03/20/23 started pembrolizumab-had one episode of this and had such a flare in his inflammatory arthritis that this was held.  Have notified his oncologist that he is here, Dr. Cherly Hensen. Will hold his Lenvatinib for today until we find source of sepsis  -CT abdomen/pelvis with growth of tumor in short time despite therapy. Likely will need palliative care/hospice. Dr. Cherly Hensen will come and see
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
## 2023-04-20 NOTE — Assessment & Plan Note (Addendum)
 Hgb trending downward over past month in setting of large RCC  Ten days ago hgb was 11.3 and presented today with hgb of 8.4>7.1 Symptomatic with weakness/fall Fecal occult negative  Type and screen Denies any overt bleeding or dark/bloody stools  Ferritin >2000 TIBC: 175, iron: 26 Concern he has some bleeding from large RCC Hold eliquis, discussed risks with throbus Transfuse 2 units, PRBC. Colonoscopy 2009 with recall in 5 years (do not see repeat in chart)

## 2023-04-20 NOTE — Assessment & Plan Note (Signed)
 TSH wnl 03/2023 Continue synthroid 112 mcg daily

## 2023-04-20 NOTE — Assessment & Plan Note (Signed)
 Likely secondary to sepsis Has dramatically improved with IVF from 8.1>3.9 Continue IVF  Trend

## 2023-04-20 NOTE — Assessment & Plan Note (Addendum)
 From MRI in January: The main right renal vein is distended with enhancing tumor thrombus as seen by CT. Enhancing tumor thrombus extends into the IVC and ascends up towards the liver nearly to the intrahepatic IVC. Length of IVC tumor thrombus is approximately 5 cm and the thrombus occupies up to approximately 50% of the IVC lumen. Hold eliquis as he has continued to trend his hgb down Concern for bleeding from [facility]

## 2023-04-20 NOTE — Assessment & Plan Note (Addendum)
 Initial troponin 260 with no acute changes on EKG or chest pain Delta pending Likely more demand ischemia in setting of lactic acidosis, acute BLA  On progressive/tele  Likely not a candidate for any invasive procedures  Will continue to monitor

## 2023-04-20 NOTE — ED Notes (Signed)
 Hemoccult negative.

## 2023-04-20 NOTE — ED Notes (Signed)
 MD Steinl informed troponin level 260.

## 2023-04-20 NOTE — Progress Notes (Signed)
 Pharmacy Antibiotic Note  Jay Chapman is a 72 y.o. male for which pharmacy has been consulted for cefepime and vancomycin dosing for sepsis.  Patient with a history of hypothyroidism, HLD, OSA, CKD, right RCC with lung mets and tumor thrombus.  SCr 1.9 - AKI WBC 24.6 > 14.6; LA 3.9>2.1; T 99.6; HR 93; RR 24  Plan: Cefepime 2g q12hr  Flagyl per MD Vancomycin 1000 mg given once in the ED earlier today --Full LD would be closer to 2g of vancomycin. --Will get AM random vancomycin level Monitor WBC, fever, renal function, cultures De-escalate when able  Height: 6\' 1"  (185.4 cm) Weight: 97.4 kg (214 lb 11.7 oz) IBW/kg (Calculated) : 79.9  Temp (24hrs), Avg:98.5 F (36.9 C), Min:97.5 F (36.4 C), Max:99.5 F (37.5 C)  Recent Labs  Lab 04/20/23 0840 04/20/23 0843 04/20/23 0944 04/20/23 1127 04/20/23 1354  WBC 24.6*  --   --   --   --   CREATININE 2.03* 1.90*  --   --   --   LATICACIDVEN  --   --  8.4* 6.1* 3.9*    Estimated Creatinine Clearance: 41.9 mL/min (A) (by C-G formula based on SCr of 1.9 mg/dL (H)).    Allergies  Allergen Reactions   Doxycycline Monohydrate Hives   Microbiology results: Pending  Thank you for allowing pharmacy to be a part of this patient's care.  Delmar Landau, PharmD, BCPS 04/20/2023 4:05 PM ED Clinical Pharmacist -  [phone]

## 2023-04-20 NOTE — Assessment & Plan Note (Signed)
 Baseline creatinine from 1.1-1.4 Presenting with creatinine of 2.03 Likely pre renal in setting of hypotension and intra renal in setting of sepsis UA with 6-10 RBC (renal cancer)  Will continue IVF overnight Strict I/O Avoid nephrotoxic drugs  Trend

## 2023-04-21 DIAGNOSIS — N179 Acute kidney failure, unspecified: Secondary | ICD-10-CM | POA: Diagnosis not present

## 2023-04-21 DIAGNOSIS — C641 Malignant neoplasm of right kidney, except renal pelvis: Secondary | ICD-10-CM | POA: Diagnosis not present

## 2023-04-21 LAB — COMPREHENSIVE METABOLIC PANEL
ALT: 51 U/L — ABNORMAL HIGH (ref 0–44)
AST: 26 U/L (ref 15–41)
Albumin: 2.1 g/dL — ABNORMAL LOW (ref 3.5–5.0)
Alkaline Phosphatase: 150 U/L — ABNORMAL HIGH (ref 38–126)
Anion gap: 5 (ref 5–15)
BUN: 29 mg/dL — ABNORMAL HIGH (ref 8–23)
CO2: 25 mmol/L (ref 22–32)
Calcium: 8 mg/dL — ABNORMAL LOW (ref 8.9–10.3)
Chloride: 106 mmol/L (ref 98–111)
Creatinine, Ser: 1.44 mg/dL — ABNORMAL HIGH (ref 0.61–1.24)
GFR, Estimated: 51 mL/min — ABNORMAL LOW (ref >60)
Glucose, Bld: 116 mg/dL — ABNORMAL HIGH (ref 70–99)
Potassium: 4.4 mmol/L (ref 3.5–5.1)
Sodium: 136 mmol/L (ref 135–145)
Total Bilirubin: 1.7 mg/dL — ABNORMAL HIGH (ref 0.0–1.2)
Total Protein: 4.7 g/dL — ABNORMAL LOW (ref 6.5–8.1)

## 2023-04-21 LAB — BLOOD CULTURE ID PANEL (REFLEXED) - BCID2

## 2023-04-21 LAB — TYPE AND SCREEN
ABO/RH(D): A POS
Antibody Screen: NEGATIVE
Unit division: 0
Unit division: 0

## 2023-04-21 LAB — BPAM RBC
Blood Product Expiration Date: 202503302359
Blood Product Expiration Date: 202503302359
ISSUE DATE / TIME: 202503071742
ISSUE DATE / TIME: 202503072059
Unit Type and Rh: 6200
Unit Type and Rh: 6200

## 2023-04-21 LAB — CBC
HCT: 24.5 % — ABNORMAL LOW (ref 39.0–52.0)
Hemoglobin: 8.2 g/dL — ABNORMAL LOW (ref 13.0–17.0)
MCH: 29.4 pg (ref 26.0–34.0)
MCHC: 33.5 g/dL (ref 30.0–36.0)
MCV: 87.8 fL (ref 80.0–100.0)
Platelets: 145 10*3/uL — ABNORMAL LOW (ref 150–400)
RBC: 2.79 MIL/uL — ABNORMAL LOW (ref 4.22–5.81)
RDW: 16.3 % — ABNORMAL HIGH (ref 11.5–15.5)
WBC: 13.7 10*3/uL — ABNORMAL HIGH (ref 4.0–10.5)
nRBC: 0 % (ref 0.0–0.2)

## 2023-04-21 LAB — LACTIC ACID, PLASMA: Lactic Acid, Venous: 1.5 mmol/L (ref 0.5–1.9)

## 2023-04-21 LAB — VANCOMYCIN, RANDOM: Vancomycin Rm: 5 ug/mL

## 2023-04-21 NOTE — Progress Notes (Addendum)
 [facility] PROGRESS NOTE   Jay Chapman BMW:[id number] DOB: 1950/03/03 DOA: 04/20/2023  PCP: [name], MD  Brief History: 73 y.o. 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 Initially called as a code stroke, but this was ruled out.  He also was experiencing significant abdominal pain.  Evaluation in the ED raise concern for progression of his renal cell carcinoma.  He was hospitalized for further management.    Consultants: Medical oncology.  Palliative care  Procedures: None    Subjective/Interval History: Patient mentioned that as long as he is laying still he does not have significant pain but as soon as he gets up and has to move around pain goes up to 10 out of 10 in intensity.  Located in the abdominal area.  Some radiation to the back.    Assessment/Plan:  Concern for sepsis Found to have leukocytosis with a WBC of 24.6.  Was tachycardic and had lactic acidosis.  No fever was recorded.  UA did not suggest infection.  No other areas of infection noted.  1 set of blood cultures positive for gram-positive cocci.  However it could just be a contaminant. His symptoms and presentation could just have been because of hypokalemia and his malignancy.  Patient in process of being transitioned to hospice/comfort care.  We will discontinue all antibiotics for now.  Of note his lactic acid level is normal today.  WBC is 13.7 this morning.  Renal cell carcinoma of the right kidney with metastases He is followed by medical oncology..  He has been on lenvatinib.  Seen by medical oncology yesterday.  Unfortunately no other treatment options are available for him.  Patient elected to transition to comfort/hospice.  Cancer associated pain Patient usually does not  take any pain medicines apart from acetaminophen.  He is currently on oxycodone and morphine as needed.  Will see how he does with this current pain regimen.  May need long-acting pain medications but hopefully his pain will be reasonably well-controlled just with the as needed medications.  Acute kidney injury on chronic kidney disease stage IIIa Baseline creatinine is 1.1-1.4.  Came in with a creatinine of 2.03.  He was hydrated.  Improvement in creatinine noted this morning.  No further blood work per patient request.  Acute on chronic anemia Hemoglobin has been drifting down over the past month or so.  No overt bleeding.  Stable this morning.  No further blood draws going forward.  Elevated troponin Possibly demand ischemia.  No chest pain.  No further workup at this time.  Tumor thrombus of inferior vena cava From MRI in January: The main right renal vein is distended with enhancing tumor thrombus as seen by CT. Enhancing tumor thrombus extends into the IVC and ascends up towards the liver nearly to the intrahepatic IVC. Length of IVC tumor thrombus is approximately 5 cm and the thrombus occupies up to approximately 50% of the IVC lumen. Eliquis currently on hold.  There was some concern for bleeding from his RCC though no overt bleeding has been noted.  Abnormal LFTs Mildly abnormal LFTs were noted.  Improved this morning.  Since he is being transitioned to hospice no further workup is planned.  Lactic acidosis Improved.  Inflammatory arthritis On long steroid taper which will be continued since it  will elevate his symptoms.  Essential hypertension Benazepril on hold.  Occasional high readings noted.  Possibly secondary to pain.  Hypothyroidism Continue levothyroxine.  Goals of care After discussions with medical oncology patient has elected to transition to hospice.  He is not certain whether he wants to go home or to an inpatient hospice facility.  It partly depends on his  ability to get up and move around which depends on his pain level.  He would like to discuss these issues with the hospice personnel.  DVT Prophylaxis: SCDs Code Status: DNR Family Communication: Discussed with patient and his wife Disposition Plan: To be determined  Status is: Inpatient Remains inpatient appropriate because: Cancer associated pain      Medications: Scheduled:  levothyroxine  112 mcg Oral Q0600   pantoprazole  40 mg Oral Daily   predniSONE  40 mg Oral Daily   Followed by   Melene Muller ON 04/24/2023] predniSONE  30 mg Oral Daily   Followed by   Melene Muller ON 05/01/2023] predniSONE  20 mg Oral Daily   Followed by   Melene Muller ON 05/08/2023] predniSONE  10 mg Oral Daily   Continuous: ZOX:[id number] **OR** acetaminophen, morphine injection, ondansetron **OR** ondansetron (ZOFRAN) IV, oxyCODONE  Antibiotics: Anti-infectives (From admission, onward)    Start     Dose/Rate Route Frequency Ordered Stop   04/21/23 0000  ceFEPIme (MAXIPIME) 2 g in sodium chloride 0.9 % 100 mL IVPB  Status:  Discontinued        2 g 200 mL/hr over 30 Minutes Intravenous Every 12 hours 04/20/23 2028 04/21/23 0852   04/20/23 2200  metroNIDAZOLE (FLAGYL) IVPB 500 mg  Status:  Discontinued        500 mg 100 mL/hr over 60 Minutes Intravenous Every 12 hours 04/20/23 1544 04/21/23 0852   04/20/23 2028  vancomycin variable dose per unstable renal function (pharmacist dosing)  Status:  Discontinued         Does not apply See admin instructions 04/20/23 2028 04/21/23 0852   04/20/23 1200  ceFEPIme (MAXIPIME) 2 g in sodium chloride 0.9 % 100 mL IVPB        2 g 200 mL/hr over 30 Minutes Intravenous  Once 04/20/23 1156 04/20/23 1255   04/20/23 1200  metroNIDAZOLE (FLAGYL) IVPB 500 mg        500 mg 100 mL/hr over 60 Minutes Intravenous  Once 04/20/23 1156 04/20/23 1312   04/20/23 1200  vancomycin (VANCOCIN) IVPB 1000 mg/200 mL premix        1,000 mg 200 mL/hr over 60 Minutes Intravenous  Once 04/20/23 1156  04/20/23 1341       Objective:  Vital Signs  Vitals:   04/21/23 0100 04/21/23 0300 04/21/23 0400 04/21/23 0500  BP:   (!) 131/113   Pulse:   84   Resp: 18 18 18 18   Temp:   98 F (36.7 C)   TempSrc:   Oral   SpO2:   96%   Weight:      Height:        Intake/Output Summary (Last 24 hours) at 04/21/2023 0913 Last data filed at 04/21/2023 0600 Gross per 24 hour  Intake 4094.63 ml  Output 800 ml  Net 3294.63 ml   Filed Weights   04/20/23 0800 04/20/23 0859 04/20/23 2155  Weight: 97.4 kg 97.4 kg 99.9 kg    General appearance: Awake alert.  In no distress Resp: Clear to auscultation bilaterally.  Normal effort Cardio: S1-S2 is normal regular.  No S3-S4.  No rubs murmurs or bruit GI: Abdomen is tender.  No rebound rigidity or guarding.   Lab Results:  Data Reviewed: I have personally reviewed following labs and reports of the imaging studies  CBC: Recent Labs  Lab 04/20/23 0840 04/20/23 0843 04/20/23 1602 04/21/23 0523  WBC 24.6*  --  14.6* 13.7*  NEUTROABS 21.4*  --   --   --   HGB 8.4* 8.2* 7.1* 8.2*  HCT 27.3* 24.0* 22.3* 24.5*  MCV 93.2  --  90.7 87.8  PLT 227  --  178 145*    Basic Metabolic Panel: Recent Labs  Lab 04/20/23 0840 04/20/23 0843 04/21/23 0523  NA 137 135 136  K 4.6 4.5 4.4  CL 102 102 106  CO2 19*  --  25  GLUCOSE 188* 179* 116*  BUN 35* 33* 29*  CREATININE 2.03* 1.90* 1.44*  CALCIUM 8.1*  --  8.0*    GFR: Estimated Creatinine Clearance: 56 mL/min (A) (by C-G formula based on SCr of 1.44 mg/dL (H)).  Liver Function Tests: Recent Labs  Lab 04/20/23 0840 04/21/23 0523  AST 47* 26  ALT 81* 51*  ALKPHOS 211* 150*  BILITOT 1.9* 1.7*  PROT 5.3* 4.7*  ALBUMIN 2.4* 2.1*     Coagulation Profile: Recent Labs  Lab 04/20/23 0840  INR 1.5*    Anemia Panel: Recent Labs    04/20/23 0930  VITAMINB12 353  FOLATE 8.0  FERRITIN 2,155*  TIBC 175*  IRON 26*  RETICCTPCT 3.5*    Recent Results (from the past 240 hours)   Blood culture (routine x 2)     Status: None (Preliminary result)   Collection Time: 04/20/23  9:30 AM   Specimen: BLOOD LEFT ARM  Result Value Ref Range Status   Specimen Description BLOOD LEFT ARM  Final   Special Requests   Final    BOTTLES DRAWN AEROBIC AND ANAEROBIC Blood Culture adequate volume   Culture  Setup Time   Final    GRAM POSITIVE COCCI IN CLUSTERS AEROBIC BOTTLE ONLY CRITICAL RESULT CALLED TO, READ BACK BY AND VERIFIED WITH: Ebony Cargo [id number] AT 0815 BY EC Performed at [facility], [address], [place], [place] [address]    Culture GRAM POSITIVE COCCI  Final   Report Status PENDING  Incomplete  Blood Culture ID Panel (Reflexed)     Status: Abnormal   Collection Time: 04/20/23  9:30 AM  Result Value Ref Range Status   Enterococcus faecalis NOT DETECTED NOT DETECTED Final   Enterococcus Faecium NOT DETECTED NOT DETECTED Final   Listeria monocytogenes NOT DETECTED NOT DETECTED Final   Staphylococcus species DETECTED (A) NOT DETECTED Final    Comment: CRITICAL RESULT CALLED TO, READ BACK BY AND VERIFIED WITH: PHARMD JENNT ZHOU [id number] AT 0815 BY EC    Staphylococcus aureus (BCID) NOT DETECTED NOT DETECTED Final   Staphylococcus epidermidis NOT DETECTED NOT DETECTED Final   Staphylococcus lugdunensis NOT DETECTED NOT DETECTED Final   Streptococcus species NOT DETECTED NOT DETECTED Final   Streptococcus agalactiae NOT DETECTED NOT DETECTED Final   Streptococcus pneumoniae NOT DETECTED NOT DETECTED Final   Streptococcus pyogenes NOT DETECTED NOT DETECTED Final   A.calcoaceticus-baumannii NOT DETECTED NOT DETECTED Final   Bacteroides fragilis NOT DETECTED NOT DETECTED Final   Enterobacterales NOT DETECTED NOT DETECTED Final   Enterobacter cloacae complex NOT DETECTED NOT DETECTED Final   Escherichia coli NOT DETECTED NOT DETECTED Final   Klebsiella aerogenes NOT DETECTED NOT DETECTED Final  Klebsiella oxytoca NOT DETECTED NOT DETECTED Final    Klebsiella pneumoniae NOT DETECTED NOT DETECTED Final   Proteus species NOT DETECTED NOT DETECTED Final   Salmonella species NOT DETECTED NOT DETECTED Final   Serratia marcescens NOT DETECTED NOT DETECTED Final   Haemophilus influenzae NOT DETECTED NOT DETECTED Final   Neisseria meningitidis NOT DETECTED NOT DETECTED Final   Pseudomonas aeruginosa NOT DETECTED NOT DETECTED Final   Stenotrophomonas maltophilia NOT DETECTED NOT DETECTED Final   Candida albicans NOT DETECTED NOT DETECTED Final   Candida auris NOT DETECTED NOT DETECTED Final   Candida glabrata NOT DETECTED NOT DETECTED Final   Candida krusei NOT DETECTED NOT DETECTED Final   Candida parapsilosis NOT DETECTED NOT DETECTED Final   Candida tropicalis NOT DETECTED NOT DETECTED Final   Cryptococcus neoformans/gattii NOT DETECTED NOT DETECTED Final    Comment: Performed at [facility], [address], [place], [place] [address]  Blood culture (routine x 2)     Status: None (Preliminary result)   Collection Time: 04/20/23  9:35 AM   Specimen: BLOOD RIGHT ARM  Result Value Ref Range Status   Specimen Description BLOOD RIGHT ARM  Final   Special Requests   Final    BOTTLES DRAWN AEROBIC AND ANAEROBIC Blood Culture adequate volume   Culture  Setup Time   Final    GRAM POSITIVE COCCI AEROBIC BOTTLE ONLY Performed at [facility], [address], [place], [place] [address]    Culture GRAM POSITIVE COCCI  Final   Report Status PENDING  Incomplete      Radiology Studies: CT ABDOMEN PELVIS WO CONTRAST Result Date: 04/20/2023 CLINICAL DATA:  Acute generalized abdominal pain. History of renal cell carcinoma. EXAM: CT ABDOMEN AND PELVIS WITHOUT CONTRAST TECHNIQUE: Multidetector CT imaging of the abdomen and pelvis was performed following the standard protocol without IV contrast. RADIATION DOSE REDUCTION: This exam was performed according to the departmental dose-optimization program which includes automated exposure  control, adjustment of the mA and/or kV according to patient size and/or use of iterative reconstruction technique. COMPARISON:  January 24, 2023. FINDINGS: Lower chest: Minimal bilateral pleural effusions are noted with adjacent subsegmental atelectasis. Hepatobiliary: No focal liver abnormality is seen. No gallstones, gallbladder wall thickening, or biliary dilatation. Pancreas: Unremarkable. No pancreatic ductal dilatation or surrounding inflammatory changes. Spleen: Normal in size without focal abnormality. Adrenals/Urinary Tract: Adrenal glands appear normal. Left parapelvic cysts are noted. Left renal cysts are noted which no further follow-up is required. 21 x 17 cm right renal heterogeneous mass is noted which is enlarged compared to prior exam and consistent with renal cell carcinoma. It appears to extend through Gerota's fascia into the anterior abdominal wall in the right upper quadrant. Mild to moderate inflammatory stranding is noted around right kidney and right pararenal space. Normal right renal parenchyma is not visualized. Contrast filling of urinary bladder is noted. Stomach/Bowel: Stomach is unremarkable. There is no evidence of bowel obstruction or inflammation. Sigmoid diverticulosis is noted without inflammation. Stool is noted throughout the colon. The appendix is not visualized. Vascular/Lymphatic: Aortic atherosclerosis. No enlarged abdominal or pelvic lymph nodes. Reproductive: Mild prostatic enlargement is noted. Other: No adenopathy or hernia is noted. Musculoskeletal: No acute or significant osseous findings. IMPRESSION: 21 x 17 cm heterogeneous mass is seen involving right kidney consistent with renal cell carcinoma which has significantly enlarged since prior exam. Mild to moderate perinephric stranding is noted. The mass appears to extend into the anterior abdominal wall in the right  upper quadrant of the abdomen. Minimal bilateral pleural effusions are noted with minimal adjacent  subsegmental atelectasis. Mild prostatic enlargement. Aortic Atherosclerosis (ICD10-I70.0). Electronically Signed   By: Lupita Raider M.D.   On: 04/20/2023 15:26   DG Chest Port 1 View Result Date: 04/20/2023 CLINICAL DATA:  Weakness and stroke EXAM: PORTABLE CHEST 1 VIEW COMPARISON:  CT chest dated 02/12/2023 FINDINGS: Normal lung volumes. Left basilar patchy and linear opacity. No pleural effusion or pneumothorax. The heart size and mediastinal contours are within normal limits. No acute osseous abnormality. IMPRESSION: Left basilar patchy and linear opacity, likely atelectasis. Electronically Signed   By: Agustin Cree M.D.   On: 04/20/2023 11:48   CT VENOGRAM HEAD Result Date: 04/20/2023 CLINICAL DATA:  73 year old male code stroke presentation with aphasia. EXAM: CT VENOGRAM HEAD TECHNIQUE: Venographic phase images of the brain were obtained following the administration of intravenous contrast. Multiplanar reformats and maximum intensity projections were generated. RADIATION DOSE REDUCTION: This exam was performed according to the departmental dose-optimization program which includes automated exposure control, adjustment of the mA and/or kV according to patient size and/or use of iterative reconstruction technique. CONTRAST:  OMNIPAQUE IOHEXOL 350 MG/ML SOLN COMPARISON:  CTA, CTP, and noncontrast Head CT today reported separately. FINDINGS: Venous sinuses: Superior sagittal sinus, torcula, straight sinus, vein of Galen, internal cerebral veins, inferior sagittal sinus, left greater than right transverse and sigmoid venous sinuses are enhancing and patent. Left IJ bulb also appears to be dominant, patent. Bilateral cavernous sinus seen to be enhancing on series 5, image 14 Anatomic variants: Dominant left transverse, sigmoid sinus, left IJ bulb. Other findings: 5 minute delayed images of the brain demonstrate no abnormal intracranial enhancement. Review of the MIP images confirms the above findings  IMPRESSION: Negative CT Venogram, no evidence of dural venous sinus thrombosis. Electronically Signed   By: Odessa Fleming M.D.   On: 04/20/2023 11:10   CT ANGIO HEAD NECK W WO CM W PERF (CODE STROKE) Result Date: 04/20/2023 CLINICAL DATA:  73 year old male code stroke presentation with aphasia. Neurologic deficit. EXAM: CT ANGIOGRAPHY HEAD AND NECK CT PERFUSION BRAIN TECHNIQUE: Multidetector CT imaging of the head and neck was performed using the standard protocol during bolus administration of intravenous contrast. Multiplanar CT image reconstructions and MIPs were obtained to evaluate the vascular anatomy. Carotid stenosis measurements (when applicable) are obtained utilizing NASCET criteria, using the distal internal carotid diameter as the denominator. Multiphase CT imaging of the brain was performed following IV bolus contrast injection. Subsequent parametric perfusion maps were calculated using RAPID software. RADIATION DOSE REDUCTION: This exam was performed according to the departmental dose-optimization program which includes automated exposure control, adjustment of the mA and/or kV according to patient size and/or use of iterative reconstruction technique. CONTRAST:  OMNIPAQUE IOHEXOL 350 MG/ML SOLN COMPARISON:  Plain head CT 0843 hours today. FINDINGS: CT Brain Perfusion Findings: ASPECTS: 10 CBF (<30%) Volume: 0mL Perfusion (Tmax>6.0s) volume: 0mL Mismatch Volume: Not applicable Infarction Location:Not applicable CTA NECK Skeleton: mild chronic appearing C7 and upper thoracic superior endplate compression. No acute osseous abnormality identified. Upper chest: Trace layering pleural fluid in both lung apices. Otherwise negative. Other neck: Neck soft tissue spaces are within normal limits. Aortic arch: Mildly tortuous 3 vessel arch.  Mild atherosclerosis. Right carotid system: Tortuous brachiocephalic artery and proximal right CCA. Patent right carotid bifurcation. Minimal atherosclerosis with no  stenosis. Left carotid system: Mildly tortuous left CCA. Patent left carotid bifurcation with minimal atherosclerosis, no stenosis. Vertebral arteries: Tortuous proximal  right subclavian artery with mild calcified plaque, no stenosis. Normal right vertebral artery origin. Right vertebral artery is patent to the skull base, mildly irregular. But there is no significant stenosis or discrete atherosclerosis. Proximal left subclavian artery is patent with minimal atherosclerosis. Left vertebral artery origin is patent, left V1 segment appears stenotic due to soft plaque (series 10, image 122). But the left vertebral remains patent. Non dominant appearance of the left vertebral artery, patent to the skull base without additional stenosis. CTA HEAD Posterior circulation: Right V4 segment is dominant and patent to the vertebrobasilar junction without stenosis. Left V4 functionally terminates in PICA although a small distal left V4 continues to the vertebrobasilar junction. Mild if any distal vertebral plaque without stenosis. Patent basilar artery with evidence of calcified plaque but no stenosis. Patent SCA and left PCA origin. Fetal type right PCA origin. Small left posterior communicating artery also present. Bilateral PCA branches are within normal limits. Anterior circulation: Both ICA siphons are patent with mild tortuosity, mild calcified plaque. No significant siphon stenosis. Normal posterior communicating artery origins. Patent carotid termini. Normal MCA and ACA origins. Diminutive bilateral ACAs with diminutive or absent anterior communicating artery. Diminutive but patent bilateral ACA branches with no focal abnormality. Left MCA M1 segment is tortuous and left MCA bifurcation is patent without stenosis. Right MCA M1 segment similarly tortuous and patent right MCA bifurcation without stenosis. Bilateral MCA branches are within normal limits. Venous sinuses: Patent. Anatomic variants: Dominant right vertebral  artery. Fetal type right PCA origin. Review of the MIP images confirms the above findings IMPRESSION: 1. CTA is negative for large vessel occlusion. Negative CT Perfusion. 2. Evidence of significant Left Vertebral artery V1 segment Stenosis due to Soft Plaque. But minimal to mild for age atherosclerosis otherwise in the head and neck. No other significant stenosis. 3.  Aortic Atherosclerosis (ICD10-I70.0). Salient findings were communicated to Dr. Amada Jupiter at [phone] hours on 04/20/2023 by text page via the [facility] messaging system. Electronically Signed   By: Odessa Fleming M.D.   On: 04/20/2023 09:18   CT HEAD CODE STROKE WO CONTRAST Result Date: 04/20/2023 CLINICAL DATA:  Code stroke.  73 year old male with aphasia. EXAM: CT HEAD WITHOUT CONTRAST TECHNIQUE: Contiguous axial images were obtained from the base of the skull through the vertex without intravenous contrast. RADIATION DOSE REDUCTION: This exam was performed according to the departmental dose-optimization program which includes automated exposure control, adjustment of the mA and/or kV according to patient size and/or use of iterative reconstruction technique. COMPARISON:  Brain MRI 03/14/2023. FINDINGS: Brain: Stable cerebral volume since the January MRI. No midline shift, mass effect, or evidence of intracranial mass lesion. Stable ventricle size and configuration. No acute intracranial hemorrhage identified. Patchy bilateral white matter hypodensity appears stable to FLAIR signal changes on previous MRI. No acute or chronic cortically based infarct identified. Vascular: Calcified atherosclerosis at the skull base. No suspicious intracranial vascular hyperdensity. Skull: Intact.  No acute osseous abnormality identified. Sinuses/Orbits: Well aerated paranasal sinuses, mild maxillary mucosal thickening or retention cysts. Other: No gaze deviation. Visualized scalp soft tissues are within normal limits. ASPECTS [facility] Stroke Program Early CT Score) Total score  (0-10 with 10 being normal): 10 IMPRESSION: 1. No acute cortically based infarct or acute intracranial hemorrhage identified. ASPECTS 10. 2. White matter disease appears stable to January MRI. 3. These results were communicated to Dr. Amada Jupiter at 8:51 am on 04/20/2023 by text page via the [facility] messaging system. Electronically Signed   By: Althea Grimmer.D.  On: 04/20/2023 08:52       LOS: 1 day   Osvaldo Shipper  [facility] Pager on [email or web address]  04/21/2023, 9:13 AM

## 2023-04-21 NOTE — Progress Notes (Signed)
 Noted plan to change to hospice, neurology will continue to be available as needed, please call with further questions.    Ritta Slot, MD [facility]  If 7pm- 7am, please page neurology on call as listed in AMION.

## 2023-04-21 NOTE — Progress Notes (Signed)
 PT Cancellation Note  Patient Details Name: Jay Chapman MRN: [id number] DOB: 22-Sep-1950   Cancelled Treatment:    Reason Eval/Treat Not Completed: Other (comment) (RN reports pt is moving okay. Does not need any rehab services and is going to hospice. Please consult if further needs arise.)  Harrel Carina, DPT, CLT  Acute Rehabilitation Services Office: [phone] (Secure chat preferred)   Claudia Desanctis 04/21/2023, 1:55 PM

## 2023-04-21 NOTE — Progress Notes (Signed)
 PHARMACY - PHYSICIAN COMMUNICATION CRITICAL VALUE ALERT - BLOOD CULTURE IDENTIFICATION (BCID)  Jay Chapman is an 73 y.o. male who presented to [facility] on 04/20/2023 with a chief complaint of weakness/syncope  Assessment:  BCID call received for 1/4 GPC identified as staph spp. Covered on vancomycin and cefepime currently,   Name of physician (or Provider) Contacted: Dr Rito Ehrlich, unit pharmacist  Current antibiotics: vancomycin, cefepime  Changes to prescribed antibiotics recommended:  would await further micro data to guide definite therapy (staph spp in 1/4 may reflect contamination as the 2 sets of cultures were drawn from opposite arms), current antimicrobial coverage appropriate for organism  Results for orders placed or performed during the hospital encounter of 04/20/23  Blood Culture ID Panel (Reflexed) (Collected: 04/20/2023  9:30 AM)  Result Value Ref Range   Enterococcus faecalis NOT DETECTED NOT DETECTED   Enterococcus Faecium NOT DETECTED NOT DETECTED   Listeria monocytogenes NOT DETECTED NOT DETECTED   Staphylococcus species DETECTED (A) NOT DETECTED   Staphylococcus aureus (BCID) NOT DETECTED NOT DETECTED   Staphylococcus epidermidis NOT DETECTED NOT DETECTED   Staphylococcus lugdunensis NOT DETECTED NOT DETECTED   Streptococcus species NOT DETECTED NOT DETECTED   Streptococcus agalactiae NOT DETECTED NOT DETECTED   Streptococcus pneumoniae NOT DETECTED NOT DETECTED   Streptococcus pyogenes NOT DETECTED NOT DETECTED   A.calcoaceticus-baumannii NOT DETECTED NOT DETECTED   Bacteroides fragilis NOT DETECTED NOT DETECTED   Enterobacterales NOT DETECTED NOT DETECTED   Enterobacter cloacae complex NOT DETECTED NOT DETECTED   Escherichia coli NOT DETECTED NOT DETECTED   Klebsiella aerogenes NOT DETECTED NOT DETECTED   Klebsiella oxytoca NOT DETECTED NOT DETECTED   Klebsiella pneumoniae NOT DETECTED NOT DETECTED   Proteus species NOT DETECTED NOT DETECTED   Salmonella  species NOT DETECTED NOT DETECTED   Serratia marcescens NOT DETECTED NOT DETECTED   Haemophilus influenzae NOT DETECTED NOT DETECTED   Neisseria meningitidis NOT DETECTED NOT DETECTED   Pseudomonas aeruginosa NOT DETECTED NOT DETECTED   Stenotrophomonas maltophilia NOT DETECTED NOT DETECTED   Candida albicans NOT DETECTED NOT DETECTED   Candida auris NOT DETECTED NOT DETECTED   Candida glabrata NOT DETECTED NOT DETECTED   Candida krusei NOT DETECTED NOT DETECTED   Candida parapsilosis NOT DETECTED NOT DETECTED   Candida tropicalis NOT DETECTED NOT DETECTED   Cryptococcus neoformans/gattii NOT DETECTED NOT DETECTED    [name] 04/21/2023  8:21 AM

## 2023-04-21 NOTE — Progress Notes (Signed)
 OT Cancellation Note  Patient Details Name: Jay Chapman MRN: [id number] DOB: 1951-01-05   Cancelled Treatment:    Reason Eval/Treat Not Completed: OT screened, no needs identified, will sign off (pt going hospice, with no acute OT needs at this time, will s/o. Please reconsult if there is a change in pt status.)  Carver Fila, OTD, OTR/L [facility] [phone]   Carver Fila Koonce 04/21/2023, 2:12 PM

## 2023-04-21 NOTE — Plan of Care (Signed)
 Pt has rested quietly throughout the night with no distress noted. Alert and oriented. ON room air. SR on the monitor. Voids per urinal. Moves self in bed. No complaints  voiced.     Problem: Education: Goal: Knowledge of General Education information will improve Description: Including pain rating scale, medication(s)/side effects and non-pharmacologic comfort measures Outcome: Progressing   Problem: Clinical Measurements: Goal: Cardiovascular complication will be avoided Outcome: Progressing   Problem: Pain Managment: Goal: General experience of comfort will improve and/or be controlled Outcome: Progressing

## 2023-04-22 DIAGNOSIS — I82220 Acute embolism and thrombosis of inferior vena cava: Secondary | ICD-10-CM | POA: Diagnosis not present

## 2023-04-22 DIAGNOSIS — D499 Neoplasm of unspecified behavior of unspecified site: Secondary | ICD-10-CM

## 2023-04-22 LAB — URINE CULTURE: Culture: <10000 — AB

## 2023-04-22 NOTE — Progress Notes (Signed)
 [facility] PROGRESS NOTE   Jay Chapman AVW:[id number] DOB: 04/22/1950 DOA: 04/20/2023  PCP: [name], MD  Brief History: 73 y.o. 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 Initially called as a code stroke, but this was ruled out.  He also was experiencing significant abdominal pain.  Evaluation in the ED raise concern for progression of his renal cell carcinoma.  He was hospitalized for further management.    Consultants: Medical oncology.  Palliative care  Procedures: None    Subjective/Interval History:  Patient in bed, appears comfortable, denies any headache, no fever, no chest pain or pressure, no shortness of breath , some improvement in abdominal pain. No new focal weakness.  Assessment/Plan:  Concern for sepsis Found to have leukocytosis with a WBC of 24.6.  Was tachycardic and had lactic acidosis.  No fever was recorded.  UA did not suggest infection.  No other areas of infection noted.  1 set of blood cultures positive for gram-positive cocci.  However it could just be a contaminant.  Antibiotics were stopped on 04/21/2023 after patient and wife decided to go the route of comfort care and hospice.  He now wants to be discharged home with hospice, will try and make arrangements and discharge him on 04/23/2023.  Renal cell carcinoma of the right kidney with metastases He is followed by medical oncology..  He has been on lenvatinib.  Seen by medical oncology yesterday.  Unfortunately no other treatment options are available for him.  Patient elected to transition to comfort/hospice.  Cancer associated pain Patient usually does not take any pain medicines apart from acetaminophen.  He is currently on oxycodone and morphine as needed. If needed will  liberalize pain control regimen as goal of care is now comfort and hospice.  Acute kidney injury on chronic kidney disease stage IIIa Baseline creatinine is 1.1-1.4.  Came in with a creatinine of 2.03.  He was hydrated.  Improvement in creatinine noted this morning.  No further blood work per patient request.  Acute on chronic anemia Hemoglobin has been drifting down over the past month or so.  No overt bleeding.  Stable this morning.  No further blood draws going forward.  Elevated troponin Possibly demand ischemia.  No chest pain.  No further workup at this time.  Tumor thrombus of inferior vena cava From MRI in January: The main right renal vein is distended with enhancing tumor thrombus as seen by CT. Enhancing tumor thrombus extends into the IVC and ascends up towards the liver nearly to the intrahepatic IVC. Length of IVC tumor thrombus is approximately 5 cm and the thrombus occupies up to approximately 50% of the IVC lumen. Eliquis currently on hold.  There was some concern for bleeding from his RCC though no overt bleeding has been noted.  Abnormal LFTs Mildly abnormal LFTs were noted.  Improved this morning.  Since he is being transitioned to hospice no further workup is planned.  Lactic acidosis Improved.  Inflammatory arthritis On long steroid taper which will be continued since it will elevate his symptoms.  Essential hypertension Benazepril on hold.  Occasional high readings noted.  Possibly secondary to pain.  Hypothyroidism Continue levothyroxine.  Goals of care After discussions with medical oncology patient has elected to transition to hospice.  He is not certain  whether he wants to go home or to an inpatient hospice facility.  It partly depends on his ability to get up and move around which depends on his pain level.  He would like to discuss these issues with the hospice personnel.  DVT Prophylaxis: SCDs Code Status: DNR Family Communication: Discussed with  patient and his wife Disposition Plan: Home with Hospice  Status is: Inpatient Remains inpatient appropriate because: Cancer associated pain      Medications: Scheduled:  levothyroxine  112 mcg Oral Q0600   pantoprazole  40 mg Oral Daily   predniSONE  40 mg Oral Daily   Followed by   Melene Muller ON 04/24/2023] predniSONE  30 mg Oral Daily   Followed by   Melene Muller ON 05/01/2023] predniSONE  20 mg Oral Daily   Followed by   Melene Muller ON 05/08/2023] predniSONE  10 mg Oral Daily   Continuous: UEA:[id number] **OR** acetaminophen, morphine injection, ondansetron **OR** ondansetron (ZOFRAN) IV, oxyCODONE  Antibiotics: Anti-infectives (From admission, onward)    Start     Dose/Rate Route Frequency Ordered Stop   04/21/23 0000  ceFEPIme (MAXIPIME) 2 g in sodium chloride 0.9 % 100 mL IVPB  Status:  Discontinued        2 g 200 mL/hr over 30 Minutes Intravenous Every 12 hours 04/20/23 2028 04/21/23 0852   04/20/23 2200  metroNIDAZOLE (FLAGYL) IVPB 500 mg  Status:  Discontinued        500 mg 100 mL/hr over 60 Minutes Intravenous Every 12 hours 04/20/23 1544 04/21/23 0852   04/20/23 2028  vancomycin variable dose per unstable renal function (pharmacist dosing)  Status:  Discontinued         Does not apply See admin instructions 04/20/23 2028 04/21/23 0852   04/20/23 1200  ceFEPIme (MAXIPIME) 2 g in sodium chloride 0.9 % 100 mL IVPB        2 g 200 mL/hr over 30 Minutes Intravenous  Once 04/20/23 1156 04/20/23 1255   04/20/23 1200  metroNIDAZOLE (FLAGYL) IVPB 500 mg        500 mg 100 mL/hr over 60 Minutes Intravenous  Once 04/20/23 1156 04/20/23 1312   04/20/23 1200  vancomycin (VANCOCIN) IVPB 1000 mg/200 mL premix        1,000 mg 200 mL/hr over 60 Minutes Intravenous  Once 04/20/23 1156 04/20/23 1341       Objective:  Vital Signs  Vitals:   04/21/23 0800 04/21/23 2013 04/22/23 0359 04/22/23 0800  BP: (!) 148/82 (!) 143/90 (!) 166/89 (!) 149/82  Pulse: 88 (!) 105 91 85  Resp: (!) 26 18  20 20   Temp: 98.6 F (37 C) 98.8 F (37.1 C) 98.6 F (37 C) 98.2 F (36.8 C)  TempSrc: Oral Oral Oral Oral  SpO2: 96% 98% 100% 95%  Weight:      Height:        Intake/Output Summary (Last 24 hours) at 04/22/2023 0843 Last data filed at 04/22/2023 0602 Gross per 24 hour  Intake 960 ml  Output 1200 ml  Net -240 ml   Filed Weights   04/20/23 0800 04/20/23 0859 04/20/23 2155  Weight: 97.4 kg 97.4 kg 99.9 kg    Exam  Awake Alert, No new F.N deficits, Normal affect .AT,PERRAL Supple Neck, No JVD,   Symmetrical Chest wall movement, Good air movement bilaterally, CTAB RRR,No Gallops, Rubs or new Murmurs,  +ve B.Sounds, Abd Soft, No tenderness,   No Cyanosis, Clubbing or edema     Lab Results:  Data Reviewed:  I have personally reviewed following labs and reports of the imaging studies  CBC: Recent Labs  Lab 04/20/23 0840 04/20/23 0843 04/20/23 1602 04/21/23 0523  WBC 24.6*  --  14.6* 13.7*  NEUTROABS 21.4*  --   --   --   HGB 8.4* 8.2* 7.1* 8.2*  HCT 27.3* 24.0* 22.3* 24.5*  MCV 93.2  --  90.7 87.8  PLT 227  --  178 145*    Basic Metabolic Panel: Recent Labs  Lab 04/20/23 0840 04/20/23 0843 04/21/23 0523  NA 137 135 136  K 4.6 4.5 4.4  CL 102 102 106  CO2 19*  --  25  GLUCOSE 188* 179* 116*  BUN 35* 33* 29*  CREATININE 2.03* 1.90* 1.44*  CALCIUM 8.1*  --  8.0*    GFR: Estimated Creatinine Clearance: 56 mL/min (A) (by C-G formula based on SCr of 1.44 mg/dL (H)).  Liver Function Tests: Recent Labs  Lab 04/20/23 0840 04/21/23 0523  AST 47* 26  ALT 81* 51*  ALKPHOS 211* 150*  BILITOT 1.9* 1.7*  PROT 5.3* 4.7*  ALBUMIN 2.4* 2.1*     Coagulation Profile: Recent Labs  Lab 04/20/23 0840  INR 1.5*    Anemia Panel: Recent Labs    04/20/23 0930  VITAMINB12 353  FOLATE 8.0  FERRITIN 2,155*  TIBC 175*  IRON 26*  RETICCTPCT 3.5*    Recent Results (from the past 240 hours)  Blood culture (routine x 2)     Status: None (Preliminary  result)   Collection Time: 04/20/23  9:30 AM   Specimen: BLOOD LEFT ARM  Result Value Ref Range Status   Specimen Description BLOOD LEFT ARM  Final   Special Requests   Final    BOTTLES DRAWN AEROBIC AND ANAEROBIC Blood Culture adequate volume   Culture  Setup Time   Final    GRAM POSITIVE COCCI IN CLUSTERS IN BOTH AEROBIC AND ANAEROBIC BOTTLES CRITICAL RESULT CALLED TO, READ BACK BY AND VERIFIED WITH: Ebony Cargo [id number] AT 0815 BY EC Performed at [facility], [address] [place], [place] [address]    Culture GRAM POSITIVE COCCI  Final   Report Status PENDING  Incomplete  Blood Culture ID Panel (Reflexed)     Status: Abnormal   Collection Time: 04/20/23  9:30 AM  Result Value Ref Range Status   Enterococcus faecalis NOT DETECTED NOT DETECTED Final   Enterococcus Faecium NOT DETECTED NOT DETECTED Final   Listeria monocytogenes NOT DETECTED NOT DETECTED Final   Staphylococcus species DETECTED (A) NOT DETECTED Final    Comment: CRITICAL RESULT CALLED TO, READ BACK BY AND VERIFIED WITH: PHARMD JENNT ZHOU [id number] AT 0815 BY EC    Staphylococcus aureus (BCID) NOT DETECTED NOT DETECTED Final   Staphylococcus epidermidis NOT DETECTED NOT DETECTED Final   Staphylococcus lugdunensis NOT DETECTED NOT DETECTED Final   Streptococcus species NOT DETECTED NOT DETECTED Final   Streptococcus agalactiae NOT DETECTED NOT DETECTED Final   Streptococcus pneumoniae NOT DETECTED NOT DETECTED Final   Streptococcus pyogenes NOT DETECTED NOT DETECTED Final   A.calcoaceticus-baumannii NOT DETECTED NOT DETECTED Final   Bacteroides fragilis NOT DETECTED NOT DETECTED Final   Enterobacterales NOT DETECTED NOT DETECTED Final   Enterobacter cloacae complex NOT DETECTED NOT DETECTED Final   Escherichia coli NOT DETECTED NOT DETECTED Final   Klebsiella aerogenes NOT DETECTED NOT DETECTED Final   Klebsiella oxytoca NOT DETECTED NOT DETECTED Final   Klebsiella pneumoniae NOT DETECTED NOT  DETECTED Final  Proteus species NOT DETECTED NOT DETECTED Final   Salmonella species NOT DETECTED NOT DETECTED Final   Serratia marcescens NOT DETECTED NOT DETECTED Final   Haemophilus influenzae NOT DETECTED NOT DETECTED Final   Neisseria meningitidis NOT DETECTED NOT DETECTED Final   Pseudomonas aeruginosa NOT DETECTED NOT DETECTED Final   Stenotrophomonas maltophilia NOT DETECTED NOT DETECTED Final   Candida albicans NOT DETECTED NOT DETECTED Final   Candida auris NOT DETECTED NOT DETECTED Final   Candida glabrata NOT DETECTED NOT DETECTED Final   Candida krusei NOT DETECTED NOT DETECTED Final   Candida parapsilosis NOT DETECTED NOT DETECTED Final   Candida tropicalis NOT DETECTED NOT DETECTED Final   Cryptococcus neoformans/gattii NOT DETECTED NOT DETECTED Final    Comment: Performed at [facility], [address] [place], [place] [address]  Blood culture (routine x 2)     Status: None (Preliminary result)   Collection Time: 04/20/23  9:35 AM   Specimen: BLOOD RIGHT ARM  Result Value Ref Range Status   Specimen Description BLOOD RIGHT ARM  Final   Special Requests   Final    BOTTLES DRAWN AEROBIC AND ANAEROBIC Blood Culture adequate volume   Culture  Setup Time   Final    GRAM POSITIVE COCCI AEROBIC BOTTLE ONLY Performed at [facility], [address], [place], [place] [address]    Culture GRAM POSITIVE COCCI  Final   Report Status PENDING  Incomplete      Radiology Studies: CT ABDOMEN PELVIS WO CONTRAST Result Date: 04/20/2023 CLINICAL DATA:  Acute generalized abdominal pain. History of renal cell carcinoma. EXAM: CT ABDOMEN AND PELVIS WITHOUT CONTRAST TECHNIQUE: Multidetector CT imaging of the abdomen and pelvis was performed following the standard protocol without IV contrast. RADIATION DOSE REDUCTION: This exam was performed according to the departmental dose-optimization program which includes automated exposure control, adjustment of the mA and/or kV  according to patient size and/or use of iterative reconstruction technique. COMPARISON:  January 24, 2023. FINDINGS: Lower chest: Minimal bilateral pleural effusions are noted with adjacent subsegmental atelectasis. Hepatobiliary: No focal liver abnormality is seen. No gallstones, gallbladder wall thickening, or biliary dilatation. Pancreas: Unremarkable. No pancreatic ductal dilatation or surrounding inflammatory changes. Spleen: Normal in size without focal abnormality. Adrenals/Urinary Tract: Adrenal glands appear normal. Left parapelvic cysts are noted. Left renal cysts are noted which no further follow-up is required. 21 x 17 cm right renal heterogeneous mass is noted which is enlarged compared to prior exam and consistent with renal cell carcinoma. It appears to extend through Gerota's fascia into the anterior abdominal wall in the right upper quadrant. Mild to moderate inflammatory stranding is noted around right kidney and right pararenal space. Normal right renal parenchyma is not visualized. Contrast filling of urinary bladder is noted. Stomach/Bowel: Stomach is unremarkable. There is no evidence of bowel obstruction or inflammation. Sigmoid diverticulosis is noted without inflammation. Stool is noted throughout the colon. The appendix is not visualized. Vascular/Lymphatic: Aortic atherosclerosis. No enlarged abdominal or pelvic lymph nodes. Reproductive: Mild prostatic enlargement is noted. Other: No adenopathy or hernia is noted. Musculoskeletal: No acute or significant osseous findings. IMPRESSION: 21 x 17 cm heterogeneous mass is seen involving right kidney consistent with renal cell carcinoma which has significantly enlarged since prior exam. Mild to moderate perinephric stranding is noted. The mass appears to extend into the anterior abdominal wall in the right upper quadrant of the abdomen. Minimal bilateral pleural effusions are noted with minimal adjacent subsegmental atelectasis. Mild prostatic  enlargement. Aortic Atherosclerosis (ICD10-I70.0). Electronically Signed   By: Lupita Raider M.D.   On: 04/20/2023 15:26   DG Chest Port 1 View Result Date: 04/20/2023 CLINICAL DATA:  Weakness and stroke EXAM: PORTABLE CHEST 1 VIEW COMPARISON:  CT chest dated 02/12/2023 FINDINGS: Normal lung volumes. Left basilar patchy and linear opacity. No pleural effusion or pneumothorax. The heart size and mediastinal contours are within normal limits. No acute osseous abnormality. IMPRESSION: Left basilar patchy and linear opacity, likely atelectasis. Electronically Signed   By: Agustin Cree M.D.   On: 04/20/2023 11:48   CT VENOGRAM HEAD Result Date: 04/20/2023 CLINICAL DATA:  73 year old male code stroke presentation with aphasia. EXAM: CT VENOGRAM HEAD TECHNIQUE: Venographic phase images of the brain were obtained following the administration of intravenous contrast. Multiplanar reformats and maximum intensity projections were generated. RADIATION DOSE REDUCTION: This exam was performed according to the departmental dose-optimization program which includes automated exposure control, adjustment of the mA and/or kV according to patient size and/or use of iterative reconstruction technique. CONTRAST:  OMNIPAQUE IOHEXOL 350 MG/ML SOLN COMPARISON:  CTA, CTP, and noncontrast Head CT today reported separately. FINDINGS: Venous sinuses: Superior sagittal sinus, torcula, straight sinus, vein of Galen, internal cerebral veins, inferior sagittal sinus, left greater than right transverse and sigmoid venous sinuses are enhancing and patent. Left IJ bulb also appears to be dominant, patent. Bilateral cavernous sinus seen to be enhancing on series 5, image 14 Anatomic variants: Dominant left transverse, sigmoid sinus, left IJ bulb. Other findings: 5 minute delayed images of the brain demonstrate no abnormal intracranial enhancement. Review of the MIP images confirms the above findings IMPRESSION: Negative CT Venogram, no evidence  of dural venous sinus thrombosis. Electronically Signed   By: Odessa Fleming M.D.   On: 04/20/2023 11:10   CT ANGIO HEAD NECK W WO CM W PERF (CODE STROKE) Result Date: 04/20/2023 CLINICAL DATA:  73 year old male code stroke presentation with aphasia. Neurologic deficit. EXAM: CT ANGIOGRAPHY HEAD AND NECK CT PERFUSION BRAIN TECHNIQUE: Multidetector CT imaging of the head and neck was performed using the standard protocol during bolus administration of intravenous contrast. Multiplanar CT image reconstructions and MIPs were obtained to evaluate the vascular anatomy. Carotid stenosis measurements (when applicable) are obtained utilizing NASCET criteria, using the distal internal carotid diameter as the denominator. Multiphase CT imaging of the brain was performed following IV bolus contrast injection. Subsequent parametric perfusion maps were calculated using RAPID software. RADIATION DOSE REDUCTION: This exam was performed according to the departmental dose-optimization program which includes automated exposure control, adjustment of the mA and/or kV according to patient size and/or use of iterative reconstruction technique. CONTRAST:  OMNIPAQUE IOHEXOL 350 MG/ML SOLN COMPARISON:  Plain head CT 0843 hours today. FINDINGS: CT Brain Perfusion Findings: ASPECTS: 10 CBF (<30%) Volume: 0mL Perfusion (Tmax>6.0s) volume: 0mL Mismatch Volume: Not applicable Infarction Location:Not applicable CTA NECK Skeleton: mild chronic appearing C7 and upper thoracic superior endplate compression. No acute osseous abnormality identified. Upper chest: Trace layering pleural fluid in both lung apices. Otherwise negative. Other neck: Neck soft tissue spaces are within normal limits. Aortic arch: Mildly tortuous 3 vessel arch.  Mild atherosclerosis. Right carotid system: Tortuous brachiocephalic artery and proximal right CCA. Patent right carotid bifurcation. Minimal atherosclerosis with no stenosis. Left carotid system: Mildly tortuous left  CCA. Patent left carotid bifurcation with minimal atherosclerosis, no stenosis. Vertebral arteries: Tortuous proximal right subclavian artery with mild calcified plaque, no stenosis. Normal right vertebral artery origin. Right vertebral artery is patent  to the skull base, mildly irregular. But there is no significant stenosis or discrete atherosclerosis. Proximal left subclavian artery is patent with minimal atherosclerosis. Left vertebral artery origin is patent, left V1 segment appears stenotic due to soft plaque (series 10, image 122). But the left vertebral remains patent. Non dominant appearance of the left vertebral artery, patent to the skull base without additional stenosis. CTA HEAD Posterior circulation: Right V4 segment is dominant and patent to the vertebrobasilar junction without stenosis. Left V4 functionally terminates in PICA although a small distal left V4 continues to the vertebrobasilar junction. Mild if any distal vertebral plaque without stenosis. Patent basilar artery with evidence of calcified plaque but no stenosis. Patent SCA and left PCA origin. Fetal type right PCA origin. Small left posterior communicating artery also present. Bilateral PCA branches are within normal limits. Anterior circulation: Both ICA siphons are patent with mild tortuosity, mild calcified plaque. No significant siphon stenosis. Normal posterior communicating artery origins. Patent carotid termini. Normal MCA and ACA origins. Diminutive bilateral ACAs with diminutive or absent anterior communicating artery. Diminutive but patent bilateral ACA branches with no focal abnormality. Left MCA M1 segment is tortuous and left MCA bifurcation is patent without stenosis. Right MCA M1 segment similarly tortuous and patent right MCA bifurcation without stenosis. Bilateral MCA branches are within normal limits. Venous sinuses: Patent. Anatomic variants: Dominant right vertebral artery. Fetal type right PCA origin. Review of the  MIP images confirms the above findings IMPRESSION: 1. CTA is negative for large vessel occlusion. Negative CT Perfusion. 2. Evidence of significant Left Vertebral artery V1 segment Stenosis due to Soft Plaque. But minimal to mild for age atherosclerosis otherwise in the head and neck. No other significant stenosis. 3.  Aortic Atherosclerosis (ICD10-I70.0). Salient findings were communicated to Dr. Amada Jupiter at [phone] hours on 04/20/2023 by text page via the [facility] messaging system. Electronically Signed   By: Odessa Fleming M.D.   On: 04/20/2023 09:18   CT HEAD CODE STROKE WO CONTRAST Result Date: 04/20/2023 CLINICAL DATA:  Code stroke.  73 year old male with aphasia. EXAM: CT HEAD WITHOUT CONTRAST TECHNIQUE: Contiguous axial images were obtained from the base of the skull through the vertex without intravenous contrast. RADIATION DOSE REDUCTION: This exam was performed according to the departmental dose-optimization program which includes automated exposure control, adjustment of the mA and/or kV according to patient size and/or use of iterative reconstruction technique. COMPARISON:  Brain MRI 03/14/2023. FINDINGS: Brain: Stable cerebral volume since the January MRI. No midline shift, mass effect, or evidence of intracranial mass lesion. Stable ventricle size and configuration. No acute intracranial hemorrhage identified. Patchy bilateral white matter hypodensity appears stable to FLAIR signal changes on previous MRI. No acute or chronic cortically based infarct identified. Vascular: Calcified atherosclerosis at the skull base. No suspicious intracranial vascular hyperdensity. Skull: Intact.  No acute osseous abnormality identified. Sinuses/Orbits: Well aerated paranasal sinuses, mild maxillary mucosal thickening or retention cysts. Other: No gaze deviation. Visualized scalp soft tissues are within normal limits. ASPECTS [facility] Stroke Program Early CT Score) Total score (0-10 with 10 being normal): 10 IMPRESSION: 1. No  acute cortically based infarct or acute intracranial hemorrhage identified. ASPECTS 10. 2. White matter disease appears stable to January MRI. 3. These results were communicated to Dr. Amada Jupiter at 8:51 am on 04/20/2023 by text page via the [facility] messaging system. Electronically Signed   By: Odessa Fleming M.D.   On: 04/20/2023 08:52       LOS: 2 days   Signature  -  Susa Raring M.D on 04/22/2023 at 8:43 AM   -  To page go to [email or web address]

## 2023-04-22 NOTE — Plan of Care (Signed)
 Pt has rested quietly throughout the night with no distress noted. Alert and oriented. ON room air. Voids per urinal. No complaints voiced.     Problem: Health Behavior/Discharge Planning: Goal: Ability to manage health-related needs will improve Outcome: Progressing   Problem: Elimination: Goal: Will not experience complications related to urinary retention Outcome: Progressing   Problem: Pain Managment: Goal: General experience of comfort will improve and/or be controlled Outcome: Progressing

## 2023-04-23 ENCOUNTER — Telehealth: Payer: Self-pay | Admitting: *Deleted

## 2023-04-23 ENCOUNTER — Other Ambulatory Visit: Payer: Self-pay | Admitting: Pharmacy Technician

## 2023-04-23 ENCOUNTER — Other Ambulatory Visit (HOSPITAL_COMMUNITY): Payer: Self-pay

## 2023-04-23 ENCOUNTER — Telehealth: Payer: Self-pay

## 2023-04-23 DIAGNOSIS — N179 Acute kidney failure, unspecified: Secondary | ICD-10-CM | POA: Diagnosis not present

## 2023-04-23 DIAGNOSIS — R6520 Severe sepsis without septic shock: Secondary | ICD-10-CM | POA: Diagnosis not present

## 2023-04-23 DIAGNOSIS — Z515 Encounter for palliative care: Secondary | ICD-10-CM

## 2023-04-23 DIAGNOSIS — A419 Sepsis, unspecified organism: Secondary | ICD-10-CM | POA: Diagnosis not present

## 2023-04-23 LAB — CULTURE, BLOOD (ROUTINE X 2)
Special Requests: ADEQUATE
Special Requests: ADEQUATE

## 2023-04-23 MED ORDER — OXYCODONE HCL 10 MG PO TABS
10.0000 mg | ORAL_TABLET | ORAL | 0 refills | Status: AC | PRN
  Filled 2023-04-23: qty 20, 3d supply, fill #0

## 2023-04-23 MED ORDER — MORPHINE SULFATE (CONCENTRATE) 10 MG /0.5 ML PO SOLN
10.0000 mg | Freq: Four times a day (QID) | ORAL | 0 refills | Status: AC | PRN
  Filled 2023-04-23: qty 30, 15d supply, fill #0

## 2023-04-23 MED ORDER — ONDANSETRON 8 MG PO TBDP
8.0000 mg | ORAL_TABLET | Freq: Three times a day (TID) | ORAL | 0 refills | Status: AC | PRN
  Filled 2023-04-23: qty 20, 7d supply, fill #0

## 2023-04-23 NOTE — Plan of Care (Signed)
 Pt has rested quietly throughout the night with no distress noted. Alert and oriented. On room air. Pt is not on monitor. Voids per urinal. Has been medicated for pain once with relief noted. No other complaints voiced.     Problem: Education: Goal: Knowledge of General Education information will improve Description: Including pain rating scale, medication(s)/side effects and non-pharmacologic comfort measures Outcome: Progressing   Problem: Pain Managment: Goal: General experience of comfort will improve and/or be controlled Outcome: Progressing

## 2023-04-23 NOTE — Discharge Instructions (Signed)
Disposition.Home hospice Condition.  Guarded CODE STATUS.  DNR Activity.  With assistance as tolerated, full fall precautions. Diet.  Soft with feeding assistance and aspiration precautions. Goal of care.  Comfort.

## 2023-04-23 NOTE — TOC Transition Note (Addendum)
 Transition of Care [facility]) - Discharge Note   Patient Details  Name: Jay Chapman MRN: [id number] Date of Birth: 12-17-1950  Transition of [facility]) CM/SW Contact:  Ronny Bacon, RN Phone Number: 04/23/2023, 10:26 AM   Clinical Narrative:   Secure chat from [facility] staff regarding wife left voicemail inquiring about the delivery of the hospital bed.  Spoke with patient and wife at bedside, explained referral process for Hospice at home set up. Patient did not have a preference for Hospice agency. Since wife started with [facility], will continue with them. Wife requested the following DME RW, BSC/3:1, hospital bed. Information relayed to [facility] liaison. Patient will be transported home by [facility] when equipment has been delivered.  1123: Secure message from [employer], DME delivery between 2-4pm and nurse visit at 8pm. Liaison requested transportation via ambulance after 4 pm but before 8 pm. [facility] arranged for 5 pm pick up. PTAR forms on chart.    Final next level of care: Home w Hospice Care Barriers to Discharge: No Barriers Identified   Patient Goals and CMS Choice            Discharge Placement                       Discharge Plan and Services Additional resources added to the After Visit Summary for                              [facility] Agency:  Marcell Anger) Date [facility] Agency Contacted: 04/23/23 Time HH Agency Contacted: 1010 Representative spoke with at [facility] Agency: Thea Gist, Glenna Fellows RN  Social Drivers of Health (SDOH) Interventions SDOH Screenings   Food Insecurity: No Food Insecurity (04/20/2023)  Housing: Low Risk  (04/20/2023)  Transportation Needs: No Transportation Needs (04/20/2023)  Utilities: Not At Risk (04/20/2023)  Alcohol Screen: Low Risk  (01/22/2023)  Depression (PHQ2-9): Low Risk  (01/23/2023)  Financial Resource Strain: Low Risk  (01/22/2023)  Physical Activity: Sufficiently Active (01/22/2023)  Social Connections:  Moderately Isolated (04/20/2023)  Stress: Patient Declined (01/22/2023)  Tobacco Use: Low Risk  (04/20/2023)     Readmission Risk Interventions     No data to display

## 2023-04-23 NOTE — Plan of Care (Signed)
     Referral previously received for Jay Chapman for goals of care discussion. Chart reviewed and updates received from RN.   Was involved in discussion about patient care with medical team, social worker, hospice liaison.  It appears the hospitalist discussed transition to hospice care at home with the patient and his family.  Sometime over the weekend he excepted this.  Patient's wife reached out independently to hospice frantically trying to arrange services.  Hospice contacted the medical team and social worker and the plan is now in place.  The patient is being arranged for home hospice with [facility].  Patient will be discharged 5:00 today and hospice nurse will see the patient at 8 PM.  Because the patient has already been referred to and accepted home hospice, in the process of getting that set up, anticipating discharge home today the goals are clear and no need for palliative to see the patient this admission.   We will sign off for now and discontinue consult order. Please contact us and enter a new consult order for any new palliative care needs.  Thank you for your referral and allowing PMT to assist in Mikey Maffett Fesperman's care.   Wynne Dust, NP Palliative Medicine Team Phone: [phone]  NO CHARGE

## 2023-04-23 NOTE — Progress Notes (Signed)
 SLP Cancellation Note  Patient Details Name: Jay Chapman MRN: [id number] DOB: [birth year]   Cancelled treatment:        Cognitive linguistic evaluation deferred at this time.  Pt is discharging imminently with home hospice care.  SLP will sign off.    Kerrie Pleasure, MA, CCC-SLP Acute Rehabilitation Services Office: [phone] 04/23/2023, 9:12 AM

## 2023-04-23 NOTE — Discharge Summary (Signed)
 Jay Chapman:[id number] DOB: 03-20-1950 DOA: 04/20/2023  PCP: [name], MD  Admit date: 04/20/2023  Discharge date: 04/23/2023  Admitted From: Home   Disposition:  Home Hospice   Recommendations for Outpatient Follow-up:   Follow up with PCP in 1-2 weeks  PCP Please obtain BMP/CBC, 2 view CXR in 1week,  (see Discharge instructions)   PCP Please follow up on the following pending results:     Home Health: Hospice   Equipment/Devices: as below  Consultations: None  Discharge Condition: Guarded CODE STATUS: DNR  Diet Recommendation: Soft    Chief Complaint  Patient presents with   Code Stroke     Brief history of present illness from the day of admission and additional interim summary    73 y.o. 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 Initially called as a code stroke, but this was ruled out.  He also was experiencing significant abdominal pain.  Evaluation in the ED raise concern for progression of his renal cell carcinoma.  He was hospitalized for further management.  After further management and discussions with family it was decided that patient and family wanted comfort measures and be discharged home with hospice.                                                                 Hospital Course   Concern for sepsis Found to have leukocytosis with a WBC of 24.6.  Was tachycardic and had lactic acidosis.  No fever was recorded.  UA did not suggest infection.  No other areas of infection noted.  1 set of blood cultures positive for gram-positive cocci.  However it could just be a  contaminant.  Antibiotics were stopped on 04/21/2023 after patient and wife decided to go the route of comfort care and hospice.  He now wants to be discharged home with hospice, will try and make arrangements and discharge him on 04/23/2023 with home hospice, goal of care will be comfort.   Renal cell carcinoma of the right kidney with metastases He is followed by medical oncology..  He has been on lenvatinib.  Seen by medical oncology yesterday.  Unfortunately no other treatment options are available for him.  Patient elected to transition to comfort/hospice.  Now will be on comfort medications only.   Cancer associated pain Patient usually does not take any pain medicines apart from acetaminophen.  He is currently on oxycodone and morphine as needed. If needed will liberalize pain control regimen as goal of care is now comfort and hospice.   Acute kidney injury on chronic kidney disease stage IIIa Baseline  creatinine is 1.1-1.4.  Came in with a creatinine of 2.03.  He was hydrated.  Improvement in creatinine noted this morning.  No further blood work per patient request.   Acute on chronic anemia Hemoglobin has been drifting down over the past month or so.  No overt bleeding.  Stable this morning.  No further blood draws going forward.   Elevated troponin Possibly demand ischemia.  No chest pain.  No further workup at this time.   Tumor thrombus of inferior vena cava From MRI in January: The main right renal vein is distended with enhancing tumor thrombus as seen by CT. Enhancing tumor thrombus extends into the IVC and ascends up towards the liver nearly to the intrahepatic IVC. Length of IVC tumor thrombus is approximately 5 cm and the thrombus occupies up to approximately 50% of the IVC lumen. Eliquis currently on hold.  There was some concern for bleeding from his RCC though no overt bleeding has been noted.   Abnormal LFTs Mildly abnormal LFTs were noted.  Improved this morning.  Since he  is being transitioned to hospice no further workup is planned.   Lactic acidosis Improved.   Inflammatory arthritis On long steroid taper which will be continued since it will elevate his symptoms.   Essential hypertension Now only supportive care, treatment now comfort directed.   Hypothyroidism Continue levothyroxine.   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 Home hospice Condition.  Guarded CODE STATUS.  DNR Activity.  With assistance as tolerated, full fall precautions. Diet.  Soft with feeding assistance and aspiration precautions. Goal of care.  Comfort.   Increase activity slowly   Complete by: As directed        Discharge Medications   Allergies as of 04/23/2023       Reactions   Doxycycline Monohydrate Hives        Medication List     STOP taking these medications    benazepril 40 MG tablet Commonly known as: LOTENSIN   Cholecalciferol 50 MCG (2000 UT) Tabs   Eliquis 5 MG Tabs tablet Generic drug: apixaban   Lenvima (10 MG Daily Dose) capsule Generic drug: lenvatinib 10 mg daily dose       TAKE these medications    amLODipine 5 MG tablet Commonly known as: NORVASC TAKE 1 TABLET EVERY DAY   levothyroxine 112 MCG tablet Commonly known as: SYNTHROID TAKE 1 TABLET EVERY DAY   morphine CONCENTRATE 10 mg / 0.5 ml concentrated solution Take 0.5 mLs (10 mg total) by mouth every 6 (six) hours as needed (breakthrough pain).   omeprazole 20 MG  capsule Commonly known as: PRILOSEC Take 1 capsule (20 mg total) by mouth daily.   ondansetron 8 MG disintegrating tablet Commonly known as: ZOFRAN-ODT Take 1 tablet (8 mg total) by mouth every 8 (eight) hours as needed for nausea or vomiting.   Oxycodone HCl 10 MG Tabs Take 1 tablet (10 mg total) by mouth every  4 (four) hours as needed for moderate pain (pain score 4-6) or severe pain (pain score 7-10).   predniSONE 10 MG tablet Commonly known as: DELTASONE Take 6 tablets (60 mg total) by mouth daily for 7 days, THEN 4 tablets (40 mg total) daily for 7 days, THEN 3 tablets (30 mg total) daily for 7 days, THEN 2 tablets (20 mg total) daily for 7 days, THEN 1 tablet (10 mg total) daily for 7 days. Start taking on: April 10, 2023   prochlorperazine 10 MG tablet Commonly known as: COMPAZINE Take 1 tablet (10 mg total) by mouth every 6 (six) hours as needed for nausea or vomiting.               Durable Medical Equipment  (From admission, onward)           Start     Ordered   04/23/23 0754  DME 3-in-1  Once        04/23/23 0755   04/23/23 0754  DME Walker  Once       Question Answer Comment  Walker: With 5 Inch Wheels   Patient needs a walker to treat with the following condition Weakness      04/23/23 0755              Major procedures and Radiology Reports - PLEASE review detailed and final reports thoroughly  -      CT ABDOMEN PELVIS WO CONTRAST Result Date: 04/20/2023 CLINICAL DATA:  Acute generalized abdominal pain. History of renal cell carcinoma. EXAM: CT ABDOMEN AND PELVIS WITHOUT CONTRAST TECHNIQUE: Multidetector CT imaging of the abdomen and pelvis was performed following the standard protocol without IV contrast. RADIATION DOSE REDUCTION: This exam was performed according to the departmental dose-optimization program which includes automated exposure control, adjustment of the mA and/or kV according to patient size and/or use of iterative  reconstruction technique. COMPARISON:  January 24, 2023. FINDINGS: Lower chest: Minimal bilateral pleural effusions are noted with adjacent subsegmental atelectasis. Hepatobiliary: No focal liver abnormality is seen. No gallstones, gallbladder wall thickening, or biliary dilatation. Pancreas: Unremarkable. No pancreatic ductal dilatation or surrounding inflammatory changes. Spleen: Normal in size without focal abnormality. Adrenals/Urinary Tract: Adrenal glands appear normal. Left parapelvic cysts are noted. Left renal cysts are noted which no further follow-up is required. 21 x 17 cm right renal heterogeneous mass is noted which is enlarged compared to prior exam and consistent with renal cell carcinoma. It appears to extend through Gerota's fascia into the anterior abdominal wall in the right upper quadrant. Mild to moderate inflammatory stranding is noted around right kidney and right pararenal space. Normal right renal parenchyma is not visualized. Contrast filling of urinary bladder is noted. Stomach/Bowel: Stomach is unremarkable. There is no evidence of bowel obstruction or inflammation. Sigmoid diverticulosis is noted without inflammation. Stool is noted throughout the colon. The appendix is not visualized. Vascular/Lymphatic: Aortic atherosclerosis. No enlarged abdominal or pelvic lymph nodes. Reproductive: Mild prostatic enlargement is noted. Other: No adenopathy or hernia is noted. Musculoskeletal: No acute or significant osseous findings. IMPRESSION: 21 x 17 cm heterogeneous mass is seen involving right kidney consistent with renal cell carcinoma which has significantly enlarged since prior exam. Mild to moderate perinephric stranding is noted. The mass appears to extend into the anterior abdominal wall in the right upper quadrant of the abdomen. Minimal bilateral pleural effusions are noted with minimal adjacent subsegmental atelectasis. Mild prostatic enlargement. Aortic Atherosclerosis (ICD10-I70.0).  Electronically Signed   By: Roque Lias  Jr M.D.   On: 04/20/2023 15:26   DG Chest Port 1 View Result Date: 04/20/2023 CLINICAL DATA:  Weakness and stroke EXAM: PORTABLE CHEST 1 VIEW COMPARISON:  CT chest dated 02/12/2023 FINDINGS: Normal lung volumes. Left basilar patchy and linear opacity. No pleural effusion or pneumothorax. The heart size and mediastinal contours are within normal limits. No acute osseous abnormality. IMPRESSION: Left basilar patchy and linear opacity, likely atelectasis. Electronically Signed   By: Agustin Cree M.D.   On: 04/20/2023 11:48   CT VENOGRAM HEAD Result Date: 04/20/2023 CLINICAL DATA:  73 year old male code stroke presentation with aphasia. EXAM: CT VENOGRAM HEAD TECHNIQUE: Venographic phase images of the brain were obtained following the administration of intravenous contrast. Multiplanar reformats and maximum intensity projections were generated. RADIATION DOSE REDUCTION: This exam was performed according to the departmental dose-optimization program which includes automated exposure control, adjustment of the mA and/or kV according to patient size and/or use of iterative reconstruction technique. CONTRAST:  OMNIPAQUE IOHEXOL 350 MG/ML SOLN COMPARISON:  CTA, CTP, and noncontrast Head CT today reported separately. FINDINGS: Venous sinuses: Superior sagittal sinus, torcula, straight sinus, vein of Galen, internal cerebral veins, inferior sagittal sinus, left greater than right transverse and sigmoid venous sinuses are enhancing and patent. Left IJ bulb also appears to be dominant, patent. Bilateral cavernous sinus seen to be enhancing on series 5, image 14 Anatomic variants: Dominant left transverse, sigmoid sinus, left IJ bulb. Other findings: 5 minute delayed images of the brain demonstrate no abnormal intracranial enhancement. Review of the MIP images confirms the above findings IMPRESSION: Negative CT Venogram, no evidence of dural venous sinus thrombosis. Electronically  Signed   By: Odessa Fleming M.D.   On: 04/20/2023 11:10   CT ANGIO HEAD NECK W WO CM W PERF (CODE STROKE) Result Date: 04/20/2023 CLINICAL DATA:  73 year old male code stroke presentation with aphasia. Neurologic deficit. EXAM: CT ANGIOGRAPHY HEAD AND NECK CT PERFUSION BRAIN TECHNIQUE: Multidetector CT imaging of the head and neck was performed using the standard protocol during bolus administration of intravenous contrast. Multiplanar CT image reconstructions and MIPs were obtained to evaluate the vascular anatomy. Carotid stenosis measurements (when applicable) are obtained utilizing NASCET criteria, using the distal internal carotid diameter as the denominator. Multiphase CT imaging of the brain was performed following IV bolus contrast injection. Subsequent parametric perfusion maps were calculated using RAPID software. RADIATION DOSE REDUCTION: This exam was performed according to the departmental dose-optimization program which includes automated exposure control, adjustment of the mA and/or kV according to patient size and/or use of iterative reconstruction technique. CONTRAST:  OMNIPAQUE IOHEXOL 350 MG/ML SOLN COMPARISON:  Plain head CT 0843 hours today. FINDINGS: CT Brain Perfusion Findings: ASPECTS: 10 CBF (<30%) Volume: 0mL Perfusion (Tmax>6.0s) volume: 0mL Mismatch Volume: Not applicable Infarction Location:Not applicable CTA NECK Skeleton: mild chronic appearing C7 and upper thoracic superior endplate compression. No acute osseous abnormality identified. Upper chest: Trace layering pleural fluid in both lung apices. Otherwise negative. Other neck: Neck soft tissue spaces are within normal limits. Aortic arch: Mildly tortuous 3 vessel arch.  Mild atherosclerosis. Right carotid system: Tortuous brachiocephalic artery and proximal right CCA. Patent right carotid bifurcation. Minimal atherosclerosis with no stenosis. Left carotid system: Mildly tortuous left CCA. Patent left carotid bifurcation with  minimal atherosclerosis, no stenosis. Vertebral arteries: Tortuous proximal right subclavian artery with mild calcified plaque, no stenosis. Normal right vertebral artery origin. Right vertebral artery is patent to the skull base, mildly irregular. But there is no significant stenosis  or discrete atherosclerosis. Proximal left subclavian artery is patent with minimal atherosclerosis. Left vertebral artery origin is patent, left V1 segment appears stenotic due to soft plaque (series 10, image 122). But the left vertebral remains patent. Non dominant appearance of the left vertebral artery, patent to the skull base without additional stenosis. CTA HEAD Posterior circulation: Right V4 segment is dominant and patent to the vertebrobasilar junction without stenosis. Left V4 functionally terminates in PICA although a small distal left V4 continues to the vertebrobasilar junction. Mild if any distal vertebral plaque without stenosis. Patent basilar artery with evidence of calcified plaque but no stenosis. Patent SCA and left PCA origin. Fetal type right PCA origin. Small left posterior communicating artery also present. Bilateral PCA branches are within normal limits. Anterior circulation: Both ICA siphons are patent with mild tortuosity, mild calcified plaque. No significant siphon stenosis. Normal posterior communicating artery origins. Patent carotid termini. Normal MCA and ACA origins. Diminutive bilateral ACAs with diminutive or absent anterior communicating artery. Diminutive but patent bilateral ACA branches with no focal abnormality. Left MCA M1 segment is tortuous and left MCA bifurcation is patent without stenosis. Right MCA M1 segment similarly tortuous and patent right MCA bifurcation without stenosis. Bilateral MCA branches are within normal limits. Venous sinuses: Patent. Anatomic variants: Dominant right vertebral artery. Fetal type right PCA origin. Review of the MIP images confirms the above findings  IMPRESSION: 1. CTA is negative for large vessel occlusion. Negative CT Perfusion. 2. Evidence of significant Left Vertebral artery V1 segment Stenosis due to Soft Plaque. But minimal to mild for age atherosclerosis otherwise in the head and neck. No other significant stenosis. 3.  Aortic Atherosclerosis (ICD10-I70.0). Salient findings were communicated to Dr. Amada Jupiter at [phone] hours on 04/20/2023 by text page via the [facility] messaging system. Electronically Signed   By: Odessa Fleming M.D.   On: 04/20/2023 09:18   CT HEAD CODE STROKE WO CONTRAST Result Date: 04/20/2023 CLINICAL DATA:  Code stroke.  73 year old male with aphasia. EXAM: CT HEAD WITHOUT CONTRAST TECHNIQUE: Contiguous axial images were obtained from the base of the skull through the vertex without intravenous contrast. RADIATION DOSE REDUCTION: This exam was performed according to the departmental dose-optimization program which includes automated exposure control, adjustment of the mA and/or kV according to patient size and/or use of iterative reconstruction technique. COMPARISON:  Brain MRI 03/14/2023. FINDINGS: Brain: Stable cerebral volume since the January MRI. No midline shift, mass effect, or evidence of intracranial mass lesion. Stable ventricle size and configuration. No acute intracranial hemorrhage identified. Patchy bilateral white matter hypodensity appears stable to FLAIR signal changes on previous MRI. No acute or chronic cortically based infarct identified. Vascular: Calcified atherosclerosis at the skull base. No suspicious intracranial vascular hyperdensity. Skull: Intact.  No acute osseous abnormality identified. Sinuses/Orbits: Well aerated paranasal sinuses, mild maxillary mucosal thickening or retention cysts. Other: No gaze deviation. Visualized scalp soft tissues are within normal limits. ASPECTS [facility] Stroke Program Early CT Score) Total score (0-10 with 10 being normal): 10 IMPRESSION: 1. No acute cortically based infarct or acute  intracranial hemorrhage identified. ASPECTS 10. 2. White matter disease appears stable to January MRI. 3. These results were communicated to Dr. Amada Jupiter at 8:51 am on 04/20/2023 by text page via the [facility] messaging system. Electronically Signed   By: Odessa Fleming M.D.   On: 04/20/2023 08:52    Micro Results    Recent Results (from the past 240 hours)  Blood culture (routine x 2)     Status:  Abnormal (Preliminary result)   Collection Time: 04/20/23  9:30 AM   Specimen: BLOOD LEFT ARM  Result Value Ref Range Status   Specimen Description BLOOD LEFT ARM  Final   Special Requests   Final    BOTTLES DRAWN AEROBIC AND ANAEROBIC Blood Culture adequate volume   Culture  Setup Time   Final    GRAM POSITIVE COCCI IN CLUSTERS IN BOTH AEROBIC AND ANAEROBIC BOTTLES CRITICAL RESULT CALLED TO, READ BACK BY AND VERIFIED WITH: PHARMD JENNY ZHOU [id number] AT 0815 BY EC    Culture (A)  Final    STAPHYLOCOCCUS HOMINIS STAPHYLOCOCCUS EPIDERMIDIS THE SIGNIFICANCE OF ISOLATING THIS ORGANISM FROM A SINGLE SET OF BLOOD CULTURES WHEN MULTIPLE SETS ARE DRAWN IS UNCERTAIN. PLEASE NOTIFY THE MICROBIOLOGY DEPARTMENT WITHIN ONE WEEK IF SPECIATION AND SENSITIVITIES ARE REQUIRED. SUSCEPTIBILITIES TO FOLLOW FOR STAPHYLOCOCCUS HOMINIS Performed at [facility], [address] [place], [place] [address]    Report Status PENDING  Incomplete  Blood Culture ID Panel (Reflexed)     Status: Abnormal   Collection Time: 04/20/23  9:30 AM  Result Value Ref Range Status   Enterococcus faecalis NOT DETECTED NOT DETECTED Final   Enterococcus Faecium NOT DETECTED NOT DETECTED Final   Listeria monocytogenes NOT DETECTED NOT DETECTED Final   Staphylococcus species DETECTED (A) NOT DETECTED Final    Comment: CRITICAL RESULT CALLED TO, READ BACK BY AND VERIFIED WITH: PHARMD JENNT ZHOU [id number] AT 0815 BY EC    Staphylococcus aureus (BCID) NOT DETECTED NOT DETECTED Final   Staphylococcus epidermidis NOT DETECTED NOT DETECTED  Final   Staphylococcus lugdunensis NOT DETECTED NOT DETECTED Final   Streptococcus species NOT DETECTED NOT DETECTED Final   Streptococcus agalactiae NOT DETECTED NOT DETECTED Final   Streptococcus pneumoniae NOT DETECTED NOT DETECTED Final   Streptococcus pyogenes NOT DETECTED NOT DETECTED Final   A.calcoaceticus-baumannii NOT DETECTED NOT DETECTED Final   Bacteroides fragilis NOT DETECTED NOT DETECTED Final   Enterobacterales NOT DETECTED NOT DETECTED Final   Enterobacter cloacae complex NOT DETECTED NOT DETECTED Final   Escherichia coli NOT DETECTED NOT DETECTED Final   Klebsiella aerogenes NOT DETECTED NOT DETECTED Final   Klebsiella oxytoca NOT DETECTED NOT DETECTED Final   Klebsiella pneumoniae NOT DETECTED NOT DETECTED Final   Proteus species NOT DETECTED NOT DETECTED Final   Salmonella species NOT DETECTED NOT DETECTED Final   Serratia marcescens NOT DETECTED NOT DETECTED Final   Haemophilus influenzae NOT DETECTED NOT DETECTED Final   Neisseria meningitidis NOT DETECTED NOT DETECTED Final   Pseudomonas aeruginosa NOT DETECTED NOT DETECTED Final   Stenotrophomonas maltophilia NOT DETECTED NOT DETECTED Final   Candida albicans NOT DETECTED NOT DETECTED Final   Candida auris NOT DETECTED NOT DETECTED Final   Candida glabrata NOT DETECTED NOT DETECTED Final   Candida krusei NOT DETECTED NOT DETECTED Final   Candida parapsilosis NOT DETECTED NOT DETECTED Final   Candida tropicalis NOT DETECTED NOT DETECTED Final   Cryptococcus neoformans/gattii NOT DETECTED NOT DETECTED Final    Comment: Performed at [facility], [address] [place], [place] [address]  Blood culture (routine x 2)     Status: Abnormal (Preliminary result)   Collection Time: 04/20/23  9:35 AM   Specimen: BLOOD RIGHT ARM  Result Value Ref Range Status   Specimen Description BLOOD RIGHT ARM  Final   Special Requests   Final    BOTTLES DRAWN AEROBIC AND ANAEROBIC Blood Culture adequate volume   Culture   Setup Time  Final    GRAM POSITIVE COCCI AEROBIC BOTTLE ONLY Performed at [facility], [address], [place], [place] [address]    Culture STAPHYLOCOCCUS HOMINIS (A)  Final   Report Status PENDING  Incomplete  Urine Culture (for pregnant, neutropenic or urologic patients or patients with an indwelling urinary catheter)     Status: Abnormal   Collection Time: 04/20/23  3:01 PM   Specimen: Urine, Clean Catch  Result Value Ref Range Status   Specimen Description URINE, CLEAN CATCH  Final   Special Requests NONE  Final   Culture (A)  Final    <10,000 COLONIES/mL INSIGNIFICANT GROWTH Performed at [facility], [address] [place], [place] [address]    Report Status 04/22/2023 FINAL  Final    Today   Subjective    Jay Chapman today has no headache,no chest abdominal pain,no new weakness tingling or numbness, feels much better wants to go home today.    Objective   Blood pressure (!) 155/85, pulse (!) 108, temperature 98 F (36.7 C), temperature source Oral, resp. rate 18, height 6\' 1"  (1.854 m), weight 99.9 kg, SpO2 95%.   Intake/Output Summary (Last 24 hours) at 04/23/2023 0755 Last data filed at 04/23/2023 0600 Gross per 24 hour  Intake 320 ml  Output 1200 ml  Net -880 ml    Exam  Awake Alert, No new F.N deficits,    [place].AT,PERRAL Supple Neck,   Symmetrical Chest wall movement, Good air movement bilaterally, CTAB RRR,No Gallops,   +ve B.Sounds, Abd Soft, Non tender,  No Cyanosis, Clubbing or edema    Data Review   Recent Labs  Lab 04/20/23 0840 04/20/23 0843 04/20/23 1602 04/21/23 0523  WBC 24.6*  --  14.6* 13.7*  HGB 8.4* 8.2* 7.1* 8.2*  HCT 27.3* 24.0* 22.3* 24.5*  PLT 227  --  178 145*  MCV 93.2  --  90.7 87.8  MCH 28.7  --  28.9 29.4  MCHC 30.8  --  31.8 33.5  RDW 16.7*  --  16.8* 16.3*  LYMPHSABS 1.0  --   --   --   MONOABS 1.8*  --   --   --   EOSABS 0.0  --   --   --   BASOSABS 0.1  --   --   --     Recent Labs  Lab  04/20/23 0840 04/20/23 0843 04/20/23 0944 04/20/23 1127 04/20/23 1354 04/20/23 1602 04/20/23 1659 04/21/23 0523  NA 137 135  --   --   --   --   --  136  K 4.6 4.5  --   --   --   --   --  4.4  CL 102 102  --   --   --   --   --  106  CO2 19*  --   --   --   --   --   --  25  ANIONGAP 16*  --   --   --   --   --   --  5  GLUCOSE 188* 179*  --   --   --   --   --  116*  BUN 35* 33*  --   --   --   --   --  29*  CREATININE 2.03* 1.90*  --   --   --   --   --  1.44*  AST 47*  --   --   --   --   --   --  26  ALT 81*  --   --   --   --   --   --  51*  ALKPHOS 211*  --   --   --   --   --   --  150*  BILITOT 1.9*  --   --   --   --   --   --  1.7*  ALBUMIN 2.4*  --   --   --   --   --   --  2.1*  LATICACIDVEN  --   --    < > 6.1* 3.9* 2.5* 2.1* 1.5  INR 1.5*  --   --   --   --   --   --   --   CALCIUM 8.1*  --   --   --   --   --   --  8.0*   < > = values in this interval not displayed.    Total Time in preparing paper work, data evaluation and todays exam - 35 minutes  Signature  -    Susa Raring M.D on 04/23/2023 at 7:55 AM   -  To page go to [email or web address]

## 2023-04-23 NOTE — Progress Notes (Signed)
   04/23/23 1134  Spiritual Encounters  Type of Visit Initial  Care provided to: Patient  Referral source Nurse (RN/NT/LPN)  Reason for visit Routine spiritual support   Reason for Visit: Chaplain responding to page from RN and Spiritual Consult stating that Pt was leaving to go home on hospice care and would like to speak with a chaplain.  Time of Visit: 25 Minutes  Description of Visit: Chaplain arrived in room to find Pt alone. Chaplain explain reason for visit and asked Pt if it was ok to sit and talk.   Chaplain asked Pt how he was holding up.  Pt stated "better today than on Friday."  Pt began to share story of what occurred Friday ending with the physician's conclusion that "nothing else could be done."  Pt relayed that he was upset Friday, but over the weekend he has experienced a calming and an acceptance with what he terms as being "God's will"   Chaplain explored the sources of Pt's peace and Pt understands that his faith is the reason he can be calm and accept this.  Pt states his wife struggling being angry, not at anyone in particular, but at the treatment not working.  Chaplain explored Pt's support system at home and Pt has 2 sons and grandchildren that support him.  Pt also references 2 sisters and a sister-in-law. Pt feels good about support resources.  Chaplain informed Pt of availability of chaplain in hospice care and recommended that he take advantage of their availability.  Pt open to the idea.  Chaplain prayed with Pt and left the room to answer pages on his phone.   Plan of Care: No further chaplain services needed unless called.  Chaplain services remain available by Spiritual Consult or for emergent cases, paging [phone]  Chaplain Raelene Bott, MDiv [email or web address] [email or web address] [phone]

## 2023-04-23 NOTE — Telephone Encounter (Signed)
 Wife called to cancel appts. Patient is being discharged home today with Hospice care. Very appreciative of Dr Nelta Numbers care!

## 2023-04-23 NOTE — Care Management Important Message (Signed)
 Important Message  Patient Details  Name: Jay Chapman MRN: [id number] Date of Birth: 10-10-1950   Important Message Given:  Yes - Medicare IM     Dorena Bodo 04/23/2023, 4:16 PM

## 2023-04-23 NOTE — Telephone Encounter (Signed)
 Spoke with wife. She spoke with [facility] about hospital bed etc. She wants to cancel all remaining appts. Done.

## 2023-04-23 NOTE — Progress Notes (Deleted)
     Referral previously received for Jay Chapman for goals of care discussion. Chart reviewed and updates received from RN.   Was involved in discussion about patient care with medical team, social worker, hospice liaison.  It appears the hospitalist discussed transition to hospice care at home with the patient and his family.  Sometime over the weekend he excepted this.  Patient's wife reached out independently to hospice frantically trying to arrange services.  Hospice contacted the medical team and social worker and the plan is now in place.  The patient is being arranged for home hospice with [facility].  Patient will be discharged 5:00 today and hospice nurse will see the patient at 8 PM.  Because the patient has already been referred to and accepted home hospice, in the process of getting that set up, anticipating discharge home today the goals are clear and no need for palliative to see the patient this admission.   We will sign off for now and discontinue consult order. Please contact us and enter a new consult order for any new palliative care needs.  Thank you for your referral and allowing PMT to assist in Mikey Maffett Fesperman's care.   Wynne Dust, NP Palliative Medicine Team Phone: [phone]  NO CHARGE

## 2023-04-23 NOTE — Progress Notes (Signed)
 Transitioning to hospice

## 2023-04-23 NOTE — Progress Notes (Signed)
 [facility] Liaison Note  Received request from May, Transitions of Care Manager, for hospice services at home after discharge. Spoke with spouse, Jay Chapman to initiate education related to hospice philosophy, services, and team approach to care. Patient/family verbalized understanding of information given. Per discussion, the plan is for discharge home by [facility]/EMS today.   DME needs discussed. Patient has no DME in the home. Patient/family requests the following equipment for delivery: hospital bed, overbed table, bedside commode, and walker. The address has been verified and is correct in the chart. Jay Chapman is the family contact to arrange time of equipment delivery.  Please send signed and completed DNR home with patient/family. Please provide prescriptions at discharge as needed to ensure ongoing symptom management.   [facility] information and contact numbers given to [name]. Above information shared with [name], Transitions of Care Manager. Please call with any questions or concerns.   Thank you for the opportunity to participate in this patient's care.   Glenna Fellows BSN, Charity fundraiser, OCN [employer] [phone]

## 2023-04-23 NOTE — Progress Notes (Signed)
 [facility] here to get pt. Wife called to make sure she was home and alerted that they were here to pick him up. Pt has papers and meds. And left with all personal belongings including his cell phone. Morphine given for pain for ride to home and then LAC IV taken out. Pt transported via stretcher with attendants.

## 2023-04-23 NOTE — Telephone Encounter (Signed)
 Spoke to wife. He is setting up to go home with hospice. Discussed hospice will manage other medication. Will stop TKI. Ok to use steroid if inflammatory arthritis flares. She appreciates the call.

## 2023-04-24 ENCOUNTER — Telehealth: Payer: Self-pay

## 2023-04-24 ENCOUNTER — Ambulatory Visit: Payer: Medicare HMO | Admitting: Internal Medicine

## 2023-04-24 NOTE — Transitions of Care (Post Inpatient/ED Visit) (Signed)
   04/24/2023  Name: Jay Chapman MRN: [id number] DOB: Sep 14, 1950  Today's TOC FU Call Status: Unsuccessful Call (1st Attempt) Date: 04/24/23 Patient's Name and Date of Birth confirmed.  Transition Care Management Follow-up Telephone Call Date of Discharge: 04/23/23 Discharge Facility: [facility]) Type of Discharge: Inpatient Admission Primary Inpatient Discharge Diagnosis:: Volume depletion  Items Reviewed:   [facility] RN confirmed with wife patient discharged home with [facility] hospice. Wife reports they have been to the home and initiated care. No further TOC calls are indicated.     Hilbert Odor RN, CCM [place]  VBCI-Population Health RN Care Manager [phone]

## 2023-05-01 ENCOUNTER — Inpatient Hospital Stay: Payer: HMO

## 2023-05-22 ENCOUNTER — Ambulatory Visit: Payer: HMO

## 2023-05-22 ENCOUNTER — Other Ambulatory Visit: Payer: HMO

## 2023-06-12 ENCOUNTER — Ambulatory Visit: Payer: HMO

## 2023-06-12 ENCOUNTER — Other Ambulatory Visit: Payer: HMO

## 2023-07-03 ENCOUNTER — Ambulatory Visit: Payer: HMO

## 2023-07-03 ENCOUNTER — Other Ambulatory Visit: Payer: HMO

## 2023-08-14 DEATH — deceased

## 2023-08-15 ENCOUNTER — Telehealth: Payer: Self-pay | Admitting: Internal Medicine

## 2023-08-15 NOTE — Telephone Encounter (Signed)
 Copied from CRM [id number]. Topic: General - Deceased Patient >> Aug 20, 2023  8:52 AM Thersia BROCKS wrote: Jay Chapman has called in to notify Dr.John that patient has passed away

## 2023-08-15 NOTE — Telephone Encounter (Signed)
 Yes, thanks for the notification.
